# Patient Record
Sex: Female | Born: 1985 | Race: Asian | Hispanic: No | Marital: Married | Smoking: Never smoker
Health system: Southern US, Community
[De-identification: ages and names within clinical notes are randomized; demographics above are authoritative.]

## PROBLEM LIST (undated history)

## (undated) DIAGNOSIS — D649 Anemia, unspecified: Secondary | ICD-10-CM

## (undated) DIAGNOSIS — D569 Thalassemia, unspecified: Secondary | ICD-10-CM

## (undated) DIAGNOSIS — D571 Sickle-cell disease without crisis: Secondary | ICD-10-CM

## (undated) DIAGNOSIS — Q211 Atrial septal defect, unspecified: Secondary | ICD-10-CM

## (undated) HISTORY — PX: OTHER SURGICAL HISTORY: SHX169

## (undated) HISTORY — DX: Sickle-cell disease without crisis: D57.1

---

## 2007-02-23 HISTORY — PX: ASD REPAIR: SHX258

## 2007-04-25 ENCOUNTER — Ambulatory Visit: Payer: Self-pay | Admitting: Internal Medicine

## 2007-04-25 ENCOUNTER — Inpatient Hospital Stay (HOSPITAL_COMMUNITY): Admission: EM | Admit: 2007-04-25 | Discharge: 2007-04-28 | Payer: Self-pay | Admitting: Family Medicine

## 2007-04-25 ENCOUNTER — Encounter (INDEPENDENT_AMBULATORY_CARE_PROVIDER_SITE_OTHER): Payer: Self-pay | Admitting: Emergency Medicine

## 2007-05-01 ENCOUNTER — Ambulatory Visit: Payer: Self-pay | Admitting: Family Medicine

## 2007-05-01 ENCOUNTER — Encounter (INDEPENDENT_AMBULATORY_CARE_PROVIDER_SITE_OTHER): Payer: Self-pay | Admitting: Family Medicine

## 2007-05-01 DIAGNOSIS — R7881 Bacteremia: Secondary | ICD-10-CM | POA: Insufficient documentation

## 2007-05-02 ENCOUNTER — Encounter (INDEPENDENT_AMBULATORY_CARE_PROVIDER_SITE_OTHER): Payer: Self-pay | Admitting: Family Medicine

## 2007-05-05 ENCOUNTER — Encounter (INDEPENDENT_AMBULATORY_CARE_PROVIDER_SITE_OTHER): Payer: Self-pay | Admitting: Family Medicine

## 2007-05-05 ENCOUNTER — Ambulatory Visit: Payer: Self-pay | Admitting: Family Medicine

## 2007-05-05 DIAGNOSIS — Q211 Atrial septal defect: Secondary | ICD-10-CM

## 2007-05-05 DIAGNOSIS — J019 Acute sinusitis, unspecified: Secondary | ICD-10-CM

## 2007-05-05 DIAGNOSIS — R17 Unspecified jaundice: Secondary | ICD-10-CM | POA: Insufficient documentation

## 2007-05-05 DIAGNOSIS — D6489 Other specified anemias: Secondary | ICD-10-CM

## 2007-05-05 LAB — CONVERTED CEMR LAB
ALT: 13 units/L (ref 0–35)
Alkaline Phosphatase: 63 units/L (ref 39–117)
Creatinine, Ser: 0.53 mg/dL (ref 0.40–1.20)
HCT: 38.9 % (ref 36.0–46.0)
MCHC: 30.6 g/dL (ref 30.0–36.0)
MCV: 80.2 fL (ref 78.0–100.0)
Platelets: 533 10*3/uL — ABNORMAL HIGH (ref 150–400)
RDW: 16.8 % — ABNORMAL HIGH (ref 11.5–15.5)
Sodium: 139 meq/L (ref 135–145)
Total Bilirubin: 1.4 mg/dL — ABNORMAL HIGH (ref 0.3–1.2)
Total Protein: 8 g/dL (ref 6.0–8.3)

## 2007-05-08 ENCOUNTER — Encounter (INDEPENDENT_AMBULATORY_CARE_PROVIDER_SITE_OTHER): Payer: Self-pay | Admitting: Family Medicine

## 2007-07-14 ENCOUNTER — Encounter (INDEPENDENT_AMBULATORY_CARE_PROVIDER_SITE_OTHER): Payer: Self-pay | Admitting: Family Medicine

## 2007-08-10 ENCOUNTER — Ambulatory Visit: Payer: Self-pay | Admitting: Family Medicine

## 2007-08-10 DIAGNOSIS — R636 Underweight: Secondary | ICD-10-CM | POA: Insufficient documentation

## 2007-08-15 ENCOUNTER — Encounter (INDEPENDENT_AMBULATORY_CARE_PROVIDER_SITE_OTHER): Payer: Self-pay | Admitting: Family Medicine

## 2007-08-19 ENCOUNTER — Encounter (INDEPENDENT_AMBULATORY_CARE_PROVIDER_SITE_OTHER): Payer: Self-pay | Admitting: Family Medicine

## 2007-11-23 ENCOUNTER — Encounter (INDEPENDENT_AMBULATORY_CARE_PROVIDER_SITE_OTHER): Payer: Self-pay | Admitting: Family Medicine

## 2007-11-30 ENCOUNTER — Encounter (INDEPENDENT_AMBULATORY_CARE_PROVIDER_SITE_OTHER): Payer: Self-pay | Admitting: Family Medicine

## 2007-12-22 ENCOUNTER — Ambulatory Visit: Payer: Self-pay | Admitting: Family Medicine

## 2007-12-22 ENCOUNTER — Encounter (INDEPENDENT_AMBULATORY_CARE_PROVIDER_SITE_OTHER): Payer: Self-pay | Admitting: Family Medicine

## 2007-12-25 LAB — CONVERTED CEMR LAB
ALT: 9 units/L (ref 0–35)
BUN: 18 mg/dL (ref 6–23)
CO2: 22 meq/L (ref 19–32)
Calcium: 9.3 mg/dL (ref 8.4–10.5)
Chloride: 102 meq/L (ref 96–112)
Creatinine, Ser: 0.58 mg/dL (ref 0.40–1.20)
HCT: 32.8 % — ABNORMAL LOW (ref 36.0–46.0)
Hemoglobin: 9.9 g/dL — ABNORMAL LOW (ref 12.0–15.0)
MCV: 77.5 fL — ABNORMAL LOW (ref 78.0–100.0)
Platelets: 386 10*3/uL (ref 150–400)
RDW: 14.8 % (ref 11.5–15.5)
Total Bilirubin: 2.5 mg/dL — ABNORMAL HIGH (ref 0.3–1.2)
WBC: 9.1 10*3/uL (ref 4.0–10.5)

## 2009-02-22 HISTORY — PX: WISDOM TOOTH EXTRACTION: SHX21

## 2010-07-07 NOTE — Discharge Summary (Signed)
NAMEKARISA, NESSER                  ACCOUNT NO.:  [id number]   MEDICAL RECORD NO.:  [id number]          PATIENT TYPE:  INP   LOCATION:  3730                         FACILITY:  [facility]   PHYSICIAN:  Zenaida Deed. Mayford Knife, M.D.DATE OF BIRTH:  December 19, 1985   DATE OF ADMISSION:  04/25/2007  DATE OF DISCHARGE:  04/28/2007                               DISCHARGE SUMMARY   DISCHARGE DIAGNOSES:  1. Congenital atrial septal defect.  2. Viral syndrome.  3. Microcytic anemia.  4. Hyperbilirubinemia.  5. One out of two positive blood cultures with gram positive rods.  6. Elevated ferritin.   DISCHARGE MEDICATIONS:  Guaifenesin 600 mg by mouth twice daily as  needed for cough.   CONSULTS:  None.   PROCEDURES:  1. Chest x-ray that showed an enlarged right ventricle and main      pulmonary artery with a question of primary pulmonary hypertension      with questionable left to right shunt but lungs clear.  2. 2D echocardiogram showed large ASD with no evidence of left to      right shunt at this time.   LABORATORY WORKUP:  Admission labs included a hemoglobin of 7.5 with  hematocrit of 24 with an MCV of 75.9 and an RDW of 14.1. White blood  cell count was 5.7. Platelets were 219. Reticulocyte count percent was  1.1. An iron panel showed an iron level of 23, a total iron binding  capacity of 304, percent saturation of 8. Vitamin B12 level of 548, a  folate of greater than 20, and a ferritin of 2,764. The patient had  haptaglobin level of less than 6, a total bilirubin of 2.7 with a direct  bilirubin of 0.7 and an indirect bilirubin of 2. An LDH was 254.  Peripheral smear showed a  few teardrop cells and rare spherocytes, rare  bands, and vacuolization and neutrophils. At the time of discharge,  hemoglobin electrophoresis was pending as was a Coombs. TSH was 2.802  __________ was within normal limits. Post-transfusion hemoglobin was 11  and hematocrit 34. Urinalysis was negative. Transaminases were  within  normal limits. An ESR was 8. Other studies that were pending at the time  of discharge included Coombs and hemoglobin electrophoresis, a hepatitis  panel. HIV was negative. Strep was negative, and flu was negative.   HOSPITAL COURSE:  Ms. Adolph is a 25 year old female from [place] who  presented with chronic shortness of breath and fatigue with minimal  exertion secondary to a congenital atrial septal defect for which she is  followed by Dr. Ace Gins with [facility] in addition to four days of  fever, cough, congestion, myalgias, and vague abdominal pain. She denied  chest pain. In addition, she had nonbloody, nonbilious emesis and  diarrhea.   PROBLEMS:  1. Congenital heart disease. The patient has an atrial septal defect      with an ostium secundum with dilation of right heart chambers and      an enlarged left pulmonary artery. The patient had no clinical      signs of acute decompensation suggesting left  to right shunt. No      cyanosis and no hypoxia while in the hospital. Her condition was      discussed with her pediatric cardiologist, Dr. Ace Gins, who      recommended repair of her atrial septal defect via catheterization      in 6-8 weeks. The details of this hospitalization will be cc'd to      him as well as Ms. Eberwein's contact information so that this      appointment may be set up.  2. Viral syndrome. The patient presented with fever, cough,      congestion, vomiting, diarrhea, and myalgias. White blood count      within normal limits. Urinalysis was negative for infection. Rapid      flu was negative. Strep was negative. Blood cultures were obtained.      With supportive care, the patient remained afebrile. Her vomiting      and diarrhea, myalgias, congestion, and cough resolved over the      course of the hospitalization without any antibiotics. She was      thought to have a general S viral illness.  3. Microcytic anemia. The patient presented with a hemoglobin  of 7.5      with an MCV of 75.9. She did not have any source of active      bleeding. Her microcytic anemia was found to be secondary to a      mixed picture partial iron deficiency. The patient also had a      component of hemolysis with an elevated bilirubin, sphereocytes on      smear and a slightly elevated LDH. It was noted that she has mild      icterus at baseline. It was also noted that a relative  also      visiting the hospital also had mild icterus. Etiology of this      personal component of hemolytic anemia is unknown, but it certainly      could be hereditary including hereditary spherocytosis G6PD      deficiency. In addition, the patient has a component of iron      deficiency which is likely nutritional. The patient responded      appropriately to 2 units of packed red blood cells and was      discharged with a hemoglobin of 11.9. Further workup of her anemia      includes a hemoglobin electrophoresis and a Coombs  which were      pending at the time of discharge. See above laboratory section for      complete anemia workup performed during this hospitalization.  4. Elevated ferritin. This is likely an acute phase reactant. A sed      rate was obtained. It came back at 8. HIV was nonreactive, and      hepatitis panel was pending at the time of discharge. This should      be followed.  5. One out of two positive blood culture. After the patient was      discharged at the time of this dictation this morning one out of      two of the patient's blood cultures returned as positive for gram      positive rods. This is likely a contaminant; however, the patient      has been called, and she is agreeable to returning to our clinic      today, this afternoon, for two repeat blood cultures. She is  afebrile and remains asymptomatic. She did not receive any      antibiotics in the hospital. This is unlikely a bacterial      endocarditis; however, we will follow up on the two  repeat blood      cultures that she will have performed today as it is possible that      she could have had diphtheria with the symptoms she had and her      being an immigrant from [place] we are unsure of her vaccination      status. Of note, she was given her flu and pneumococcal vaccines      while in the hospital.   FOLLOWUP:  The patient is to have two repeat blood cultures drawn today,  and she will follow up with Dr. Luz Brazen at [facility] on  Friday, March 13th, at 1:30 p.m. She is to follow up with Dr. Ace Gins for  repair of atrial septal defect via catheterization in six to eight  weeks. Dr. Blima Singer office will call the patient to schedule this  appointment. Contact numbers for the patient include Daihsiu  at [phone], Hret's, who is her brother, phone number is [phone], and  Allison Quarry who is her [employer] whose phone number is [phone].      Levander Campion, M.D.  Electronically Signed      Zenaida Deed. Mayford Knife, M.D.  Electronically Signed    JH/MEDQ  D:  05/01/2007  T:  05/01/2007  Job:  [id number]   cc:   Rosiland Oz

## 2010-11-16 LAB — INFLUENZA A & B ANTIBODIES
Influenza A Virus Ab, IgG: 0.47 IV
Influenza A Virus Ab, IgM: 0.06 IV
Influenza B Virus Ab, IgM: 0.1 IV
Influenza B ab, IgG: 0.37 IV

## 2010-11-16 LAB — DIFFERENTIAL
Basophils Absolute: 0
Lymphocytes Relative: 28
Lymphs Abs: 2.5
Monocytes Absolute: 1.1 — ABNORMAL HIGH
Monocytes Relative: 12
Neutro Abs: 5.3

## 2010-11-16 LAB — HEMOGLOBIN AND HEMATOCRIT, BLOOD: Hemoglobin: 12.1

## 2010-11-16 LAB — HEPATIC FUNCTION PANEL
ALT: 13
AST: 33
Alkaline Phosphatase: 45
Bilirubin, Direct: 0.7 — ABNORMAL HIGH

## 2010-11-16 LAB — BASIC METABOLIC PANEL
BUN: 7
CO2: 25
Glucose, Bld: 81
Potassium: 3.3 — ABNORMAL LOW
Sodium: 136

## 2010-11-16 LAB — CBC
HCT: 24 — ABNORMAL LOW
HCT: 26.5 — ABNORMAL LOW
HCT: 28.4 — ABNORMAL LOW
HCT: 36.4
Hemoglobin: 7.5 — CL
Hemoglobin: 8.3 — ABNORMAL LOW
Hemoglobin: 8.8 — ABNORMAL LOW
MCHC: 31.2
MCHC: 31.3
MCHC: 32.6
MCV: 76.4 — ABNORMAL LOW
MCV: 80.5
RBC: 3.73 — ABNORMAL LOW
RBC: 4.22
RBC: 4.52
RDW: 13.7
RDW: 14
RDW: 14.1
WBC: 5.3
WBC: 8.9

## 2010-11-16 LAB — HEPATITIS PANEL, ACUTE: Hep A IgM: NEGATIVE

## 2010-11-16 LAB — COMPREHENSIVE METABOLIC PANEL
ALT: 10
AST: 23
AST: 25
Albumin: 3 — ABNORMAL LOW
BUN: 5 — ABNORMAL LOW
CO2: 24
CO2: 24
Calcium: 8.4
Chloride: 103
Chloride: 103
Creatinine, Ser: 0.57
GFR calc Af Amer: >60
GFR calc Af Amer: >60
GFR calc non Af Amer: >60
GFR calc non Af Amer: >60
Sodium: 135
Total Bilirubin: 1.5 — ABNORMAL HIGH
Total Bilirubin: 1.6 — ABNORMAL HIGH

## 2010-11-16 LAB — HIV ANTIBODY (ROUTINE TESTING W REFLEX): HIV: NONREACTIVE

## 2010-11-16 LAB — HEMOGLOBINOPATHY EVALUATION
Hgb A2 Quant: 4.2 — ABNORMAL HIGH
Hgb F Quant: 0 (ref 0.0–2.0)
Hgb S Quant: 0 % (ref 0.0–0.0)

## 2010-11-16 LAB — POCT URINALYSIS DIP (DEVICE)
Ketones, ur: 15 — AB
Operator id: 235561
Specific Gravity, Urine: 1.02

## 2010-11-16 LAB — CROSSMATCH: ABO/RH(D): O POS

## 2010-11-16 LAB — POCT I-STAT, CHEM 8
BUN: 15
Chloride: 97
Creatinine, Ser: 0.8
Potassium: 3.7
Sodium: 135

## 2010-11-16 LAB — URINALYSIS, ROUTINE W REFLEX MICROSCOPIC
Bilirubin Urine: NEGATIVE
Glucose, UA: NEGATIVE
Hgb urine dipstick: NEGATIVE
Protein, ur: NEGATIVE
Specific Gravity, Urine: 1.006
Urobilinogen, UA: 1

## 2010-11-16 LAB — RETICULOCYTES: RBC.: 4.32

## 2010-11-16 LAB — IRON AND TIBC
Saturation Ratios: 8 — ABNORMAL LOW
TIBC: 304

## 2010-11-16 LAB — LACTATE DEHYDROGENASE: LDH: 254 — ABNORMAL HIGH

## 2010-11-16 LAB — FERRITIN: Ferritin: 2764 — ABNORMAL HIGH (ref 10–291)

## 2010-11-16 LAB — CULTURE, BLOOD (ROUTINE X 2): Culture: NO GROWTH

## 2010-11-16 LAB — POCT PREGNANCY, URINE
Operator id: 235561
Preg Test, Ur: NEGATIVE

## 2010-11-16 LAB — SEDIMENTATION RATE: Sed Rate: 8

## 2010-11-16 LAB — FOLATE: Folate: >20

## 2011-01-18 ENCOUNTER — Other Ambulatory Visit (HOSPITAL_COMMUNITY): Payer: Self-pay | Admitting: Nurse Practitioner

## 2011-01-18 DIAGNOSIS — Z0489 Encounter for examination and observation for other specified reasons: Secondary | ICD-10-CM

## 2011-01-18 LAB — ABO/RH: RH Type: POSITIVE

## 2011-01-18 LAB — GC/CHLAMYDIA PROBE AMP, GENITAL: Gonorrhea: NEGATIVE

## 2011-01-18 LAB — RPR: RPR: NONREACTIVE

## 2011-01-22 ENCOUNTER — Ambulatory Visit (HOSPITAL_COMMUNITY)
Admission: RE | Admit: 2011-01-22 | Discharge: 2011-01-22 | Disposition: A | Discharge status: Home / Self Care | Payer: Medicaid Other | Source: Ambulatory Visit | Attending: Nurse Practitioner | Admitting: Nurse Practitioner

## 2011-01-22 DIAGNOSIS — Z1389 Encounter for screening for other disorder: Secondary | ICD-10-CM | POA: Insufficient documentation

## 2011-01-22 DIAGNOSIS — O3500X0 Maternal care for (suspected) central nervous system malformation or damage in fetus, unspecified, not applicable or unspecified: Secondary | ICD-10-CM | POA: Insufficient documentation

## 2011-01-22 DIAGNOSIS — O350XX0 Maternal care for (suspected) central nervous system malformation in fetus, not applicable or unspecified: Secondary | ICD-10-CM | POA: Insufficient documentation

## 2011-01-22 DIAGNOSIS — Z0489 Encounter for examination and observation for other specified reasons: Secondary | ICD-10-CM

## 2011-01-22 DIAGNOSIS — O358XX0 Maternal care for other (suspected) fetal abnormality and damage, not applicable or unspecified: Secondary | ICD-10-CM | POA: Insufficient documentation

## 2011-01-22 DIAGNOSIS — Z363 Encounter for antenatal screening for malformations: Secondary | ICD-10-CM | POA: Insufficient documentation

## 2011-01-22 NOTE — Progress Notes (Addendum)
Genetic Counseling  High-Risk Gestation Note  Appointment Date:  01/22/2011 Referred By: Orlene Erm Date of Birth:  1985/08/20 Attending: Particia Nearing, MD  Ms. Kimberlie Tungate was seen for consultation for genetic counseling because of a personal history of atrial septal defect and for an elevated MSAFP of 2.89 MoMs based on maternal serum screening through [facility].    We reviewed Ms. Poynor's maternal serum screening result, the elevation of MSAFP, and the associated 1-2% risk for a fetal open neural tube defect.   We reviewed ONTDs, the typical multifactorial etiology, and variable prognosis.  In addition, we discussed additional explanations for an elevated MSAFP including: normal variation, twins, feto-maternal bleeding, a gestational dating error, abdominal wall defects, kidney differences, oligohydramnios, and placental problems.   We reviewed additional available screening and diagnostic options including detailed ultrasound and amniocentesis.  We discussed the risks, limitations, and benefits of each.  After thoughtful consideration of these options, Ms. Danielson elected to have ultrasound, but declined amniocentesis.  She understands that ultrasound cannot rule out all birth defects or genetic syndromes.  However, she was counseled that 80-90% of fetuses with ONTDs can be detected by detailed 2nd trimester ultrasound, when well visualized.  A complete ultrasound was performed today which indicated the pregnancy to be 26 weeks and 0 days gestation, thus changing her due date. Therefore, she was too far along in pregnancy to interpret the results of her MSAFP, meaning that her pregnancy does not have an increased risk for ONTDs based on this serum screen. The ultrasound report will be sent under separate cover.  Both family histories were reviewed and found to be contributory for the patient with surgical repair of atrial septal defect (ASD) at age 54 years. She reported that this  was not diagnosed until age 89 years when her family moved from [place] to the [place]. She is otherwise healthy. Congenital heart defects occur in approximately 1% of pregnancies.  Congenital heart defects may occur due to multifactorial influences, chromosomal abnormalities, genetic syndromes or environmental exposures.  Isolated heart defects are generally multifactorial.  Given the reported family history and assuming multifactorial inheritance, the risk for a congenital heart defect in the current pregnancy is approximately 4-4.5%. We discussed the option of targeted ultrasound and fetal echocardiogram to assess fetal anatomy and fetal heart in more detail. The patient understands that ultrasound cannot rule out all birth defects prenatally. Without further information regarding the provided family history, an accurate genetic risk cannot be calculated. Further genetic counseling is warranted if more information is obtained.  Ms. Gazelle Towe denied exposure to environmental toxins or chemical agents. She denied the use of alcohol, tobacco or street drugs. She denied significant viral illnesses during the course of her pregnancy. Her medical and surgical histories were contributory for heart surgery in 2009.   I counseled Ms. Kaila Matteucci for approximately 35 minutes regarding the above risks and available options.    Quinn Plowman, MS,  Certified Genetic Counselor 01/22/2011

## 2011-02-01 ENCOUNTER — Other Ambulatory Visit: Payer: Self-pay

## 2011-02-23 NOTE — L&D Delivery Note (Signed)
Delivery Note At 2:40 AM a viable female was delivered via Vaginal, Spontaneous Delivery (Presentation: Right Occiput Anterior).  APGAR: 9, 9; weight 5 lb 11.7 oz (2600 g).   Placenta status: Intact Adherent, Manual removal Retained.  Cord: 3 vessels with the following complications: None.  Cord pH: NA  Anesthesia: Local  Episiotomy: None Lacerations: 2nd degree;Sulcus Suture Repair: 3.0 vicryl rapide Est. Blood Loss (mL): 500  Mom to postpartum.  Baby to nursery-stable. Placenta to: BS Feeding: br/bo Circ: NA Contraception: Sheila Torres, Sheila Torres 04/24/2011, 4:11 AM

## 2011-04-07 LAB — STREP B DNA PROBE: GBS: POSITIVE

## 2011-04-23 ENCOUNTER — Encounter (HOSPITAL_COMMUNITY): Payer: Self-pay

## 2011-04-23 ENCOUNTER — Inpatient Hospital Stay (HOSPITAL_COMMUNITY)
Admission: AD | Admit: 2011-04-23 | Discharge: 2011-04-26 | DRG: 774 | Disposition: A | Discharge status: Home / Self Care | Payer: Medicaid Other | Source: Ambulatory Visit | Attending: Obstetrics & Gynecology | Admitting: Obstetrics & Gynecology

## 2011-04-23 DIAGNOSIS — Z22330 Carrier of Group B streptococcus: Secondary | ICD-10-CM

## 2011-04-23 DIAGNOSIS — O99892 Other specified diseases and conditions complicating childbirth: Secondary | ICD-10-CM | POA: Diagnosis present

## 2011-04-23 DIAGNOSIS — IMO0001 Reserved for inherently not codable concepts without codable children: Secondary | ICD-10-CM

## 2011-04-23 HISTORY — DX: Anemia, unspecified: D64.9

## 2011-04-23 HISTORY — DX: Thalassemia, unspecified: D56.9

## 2011-04-23 HISTORY — DX: Atrial septal defect: Q21.1

## 2011-04-23 HISTORY — DX: Atrial septal defect, unspecified: Q21.10

## 2011-04-23 NOTE — Progress Notes (Signed)
Patient is here for labor eval. Patient is uncomfortable, denies lof. Reports good fetal movement.

## 2011-04-23 NOTE — ED Provider Notes (Signed)
History    Chief Complaint  Patient presents with  . Contractions   HPI Patient is a 26 yo G1 at [redacted]w[redacted]d presenting with one day history of contractions. She states she has had contractions all day, but she has not timed them. She has had some mucus discharge, and minimal bleeding. No gush of fluid. Good fetal movement.   OB History    Grav Para Term Preterm Abortions TAB SAB Ect Mult Living   1               Past Medical History  Diagnosis Date  . Atrial septal defect     had repair years ago  . Anemia   . Thalassemia     Past Surgical History  Procedure Date  . Atrail septal defect repair     History reviewed. No pertinent family history.  History  Substance Use Topics  . Smoking status: Never Smoker   . Smokeless tobacco: Not on file  . Alcohol Use: No    Allergies: Allergies not on file  Prescriptions prior to admission  Medication Sig Dispense Refill  . azithromycin (ZITHROMAX) 500 MG tablet Take 500 mg by mouth daily. X 5 days       . ferrous sulfate 325 (65 FE) MG tablet Take 325 mg by mouth 2 (two) times daily.          ROS See HPI above  Physical Exam   Blood pressure 125/73, pulse 90, temperature 96.5 F (35.8 C), temperature source Oral, resp. rate 20.  Physical Exam  Constitutional: She appears well-developed and well-nourished. She appears distressed (Very uncomfortable with contractions. Breathing through.).  HENT:  Head: Normocephalic and atraumatic.  Cardiovascular: Normal rate and regular rhythm.   Respiratory: Effort normal and breath sounds normal.  GI: Soft.       Gravid, toco in place  Musculoskeletal: Normal range of motion. She exhibits no edema.  Neurological: She is alert.    MAU Course  Procedures  Patient was brought to exam room and cervical check was done by RN since patient was having increased pressure Dilation: 3.5 Effacement (%): 90 Cervical Position: Middle Station: -1 (bulging) Presentation: Vertex Exam by::  Peace, rn  Repeat exam: Cervix dilated to 6cm. Dorathy Kinsman, CNM notified for admission   Assessment and Plan  26 yo G1 at [redacted]w[redacted]d presenting with contractions. - Observed in MAU. Patient continued to have contractions with change in cervix - Admitted to birthing suite   Imagene Boss 04/23/2011, 11:49 PM

## 2011-04-24 ENCOUNTER — Encounter (HOSPITAL_COMMUNITY): Payer: Self-pay | Admitting: Pediatric Intensive Care

## 2011-04-24 DIAGNOSIS — O9989 Other specified diseases and conditions complicating pregnancy, childbirth and the puerperium: Secondary | ICD-10-CM

## 2011-04-24 LAB — COMPREHENSIVE METABOLIC PANEL
ALT: 12 U/L (ref 0–35)
AST: 35 U/L (ref 0–37)
Albumin: 2.4 g/dL — ABNORMAL LOW (ref 3.5–5.2)
Alkaline Phosphatase: 150 U/L — ABNORMAL HIGH (ref 39–117)
BUN: 9 mg/dL (ref 6–23)
Chloride: 99 mEq/L (ref 96–112)
Potassium: 3.4 mEq/L — ABNORMAL LOW (ref 3.5–5.1)
Total Bilirubin: 0.8 mg/dL (ref 0.3–1.2)

## 2011-04-24 LAB — CBC
HCT: 37.1 % (ref 36.0–46.0)
HCT: 30.3 % — ABNORMAL LOW (ref 36.0–46.0)
MCH: 25.2 pg — ABNORMAL LOW (ref 26.0–34.0)
MCHC: 31.3 g/dL (ref 30.0–36.0)
MCV: 80.5 fL (ref 78.0–100.0)
Platelets: 407 10*3/uL — ABNORMAL HIGH (ref 150–400)
RDW: 15.6 % — ABNORMAL HIGH (ref 11.5–15.5)
RDW: 15.5 % (ref 11.5–15.5)
WBC: 20.8 10*3/uL — ABNORMAL HIGH (ref 4.0–10.5)
WBC: 25.3 10*3/uL — ABNORMAL HIGH (ref 4.0–10.5)

## 2011-04-24 MED ORDER — IBUPROFEN 600 MG PO TABS
600.0000 mg | ORAL_TABLET | Freq: Four times a day (QID) | ORAL | Status: DC | PRN
  Administered 2011-04-24: 600 mg via ORAL
  Filled 2011-04-24: qty 1

## 2011-04-24 MED ORDER — ONDANSETRON HCL 4 MG/2ML IJ SOLN: 4.0000 mg | INTRAMUSCULAR | Status: DC | PRN

## 2011-04-24 MED ORDER — CITRIC ACID-SODIUM CITRATE 334-500 MG/5ML PO SOLN: 30.0000 mL | ORAL | Status: DC | PRN

## 2011-04-24 MED ORDER — TETANUS-DIPHTH-ACELL PERTUSSIS 5-2.5-18.5 LF-MCG/0.5 IM SUSP
0.5000 mL | Freq: Once | INTRAMUSCULAR | Status: AC
  Administered 2011-04-25: 0.5 mL via INTRAMUSCULAR
  Filled 2011-04-24: qty 0.5

## 2011-04-24 MED ORDER — MISOPROSTOL 200 MCG PO TABS
ORAL_TABLET | ORAL | Status: AC
  Filled 2011-04-24: qty 5

## 2011-04-24 MED ORDER — OXYTOCIN BOLUS FROM INFUSION
500.0000 mL | Freq: Once | INTRAVENOUS | Status: DC
  Filled 2011-04-24: qty 500
  Filled 2011-04-24: qty 1000

## 2011-04-24 MED ORDER — ONDANSETRON HCL 4 MG/2ML IJ SOLN: 4.0000 mg | Freq: Four times a day (QID) | INTRAMUSCULAR | Status: DC | PRN

## 2011-04-24 MED ORDER — SIMETHICONE 80 MG PO CHEW: 80.0000 mg | CHEWABLE_TABLET | ORAL | Status: DC | PRN

## 2011-04-24 MED ORDER — DIPHENHYDRAMINE HCL 25 MG PO CAPS: 25.0000 mg | ORAL_CAPSULE | Freq: Four times a day (QID) | ORAL | Status: DC | PRN

## 2011-04-24 MED ORDER — WITCH HAZEL-GLYCERIN EX PADS: 1.0000 "application " | MEDICATED_PAD | CUTANEOUS | Status: DC | PRN

## 2011-04-24 MED ORDER — OXYCODONE-ACETAMINOPHEN 5-325 MG PO TABS
1.0000 | ORAL_TABLET | ORAL | Status: DC | PRN
  Administered 2011-04-24: 1 via ORAL
  Filled 2011-04-24: qty 1

## 2011-04-24 MED ORDER — FLEET ENEMA 7-19 GM/118ML RE ENEM: 1.0000 | ENEMA | RECTAL | Status: DC | PRN

## 2011-04-24 MED ORDER — NALBUPHINE SYRINGE 5 MG/0.5 ML
5.0000 mg | INJECTION | INTRAMUSCULAR | Status: DC | PRN
  Filled 2011-04-24: qty 0.5

## 2011-04-24 MED ORDER — LACTATED RINGERS IV SOLN: INTRAVENOUS | Status: DC

## 2011-04-24 MED ORDER — MISOPROSTOL 200 MCG PO TABS
1000.0000 ug | ORAL_TABLET | Freq: Once | ORAL | Status: AC
  Administered 2011-04-24: 1000 ug via RECTAL

## 2011-04-24 MED ORDER — HYDROXYZINE HCL 50 MG/ML IM SOLN: 50.0000 mg | Freq: Four times a day (QID) | INTRAMUSCULAR | Status: DC | PRN

## 2011-04-24 MED ORDER — DIBUCAINE 1 % RE OINT: 1.0000 "application " | TOPICAL_OINTMENT | RECTAL | Status: DC | PRN

## 2011-04-24 MED ORDER — LACTATED RINGERS IV SOLN: 500.0000 mL | INTRAVENOUS | Status: DC | PRN

## 2011-04-24 MED ORDER — LIDOCAINE HCL (PF) 1 % IJ SOLN
30.0000 mL | INTRAMUSCULAR | Status: DC | PRN
  Administered 2011-04-24: 30 mL via SUBCUTANEOUS
  Filled 2011-04-24: qty 30

## 2011-04-24 MED ORDER — ACETAMINOPHEN 325 MG PO TABS: 650.0000 mg | ORAL_TABLET | ORAL | Status: DC | PRN

## 2011-04-24 MED ORDER — ONDANSETRON HCL 4 MG PO TABS: 4.0000 mg | ORAL_TABLET | ORAL | Status: DC | PRN

## 2011-04-24 MED ORDER — BENZOCAINE-MENTHOL 20-0.5 % EX AERO: 1.0000 "application " | INHALATION_SPRAY | CUTANEOUS | Status: DC | PRN

## 2011-04-24 MED ORDER — ZOLPIDEM TARTRATE 5 MG PO TABS: 5.0000 mg | ORAL_TABLET | Freq: Every evening | ORAL | Status: DC | PRN

## 2011-04-24 MED ORDER — HYDROXYZINE HCL 50 MG PO TABS: 50.0000 mg | ORAL_TABLET | Freq: Four times a day (QID) | ORAL | Status: DC | PRN

## 2011-04-24 MED ORDER — BENZOCAINE-MENTHOL 20-0.5 % EX AERO
INHALATION_SPRAY | CUTANEOUS | Status: AC
  Administered 2011-04-24: 08:00:00
  Filled 2011-04-24: qty 56

## 2011-04-24 MED ORDER — PRENATAL MULTIVITAMIN CH
1.0000 | ORAL_TABLET | Freq: Every day | ORAL | Status: DC
  Administered 2011-04-24 – 2011-04-25 (×2): 1 via ORAL
  Filled 2011-04-24 (×2): qty 1

## 2011-04-24 MED ORDER — OXYTOCIN 20 UNITS IN LACTATED RINGERS INFUSION - SIMPLE
125.0000 mL/h | Freq: Once | INTRAVENOUS | Status: AC
  Administered 2011-04-24: 125 mL/h via INTRAVENOUS

## 2011-04-24 MED ORDER — SENNOSIDES-DOCUSATE SODIUM 8.6-50 MG PO TABS
2.0000 | ORAL_TABLET | Freq: Every day | ORAL | Status: DC
  Administered 2011-04-24 – 2011-04-25 (×2): 2 via ORAL

## 2011-04-24 MED ORDER — OXYCODONE-ACETAMINOPHEN 5-325 MG PO TABS: 1.0000 | ORAL_TABLET | ORAL | Status: DC | PRN

## 2011-04-24 MED ORDER — IBUPROFEN 600 MG PO TABS
600.0000 mg | ORAL_TABLET | Freq: Four times a day (QID) | ORAL | Status: DC
  Administered 2011-04-24 – 2011-04-26 (×8): 600 mg via ORAL
  Filled 2011-04-24 (×8): qty 1

## 2011-04-24 MED ORDER — LANOLIN HYDROUS EX OINT: TOPICAL_OINTMENT | CUTANEOUS | Status: DC | PRN

## 2011-04-24 MED ORDER — SODIUM CHLORIDE 0.9 % IV SOLN
2.0000 g | Freq: Once | INTRAVENOUS | Status: AC
  Administered 2011-04-24: 2 g via INTRAVENOUS
  Filled 2011-04-24: qty 2000

## 2011-04-24 NOTE — Progress Notes (Signed)
Pt delivered viable female with APGARS 9, 9. Smith CNM present and [place], Resident.

## 2011-04-24 NOTE — Progress Notes (Signed)
Pt to room 108 via w/c in stable condition.

## 2011-04-24 NOTE — H&P (Signed)
Sheila Torres is a 26 y.o. year old G1P0 female at [redacted]w[redacted]d weeks gestation who presents to MAU reporting Labor. She denies VB or LOF.  Maternal Medical History:  Reason for admission: Reason for admission: contractions.  Contractions: Onset was 6-12 hours ago.   Frequency: regular.    Fetal activity: Perceived fetal activity is normal.    Prenatal Complications - Diabetes: none.    Patient Active Problem List  Diagnoses  . OTHER SPECIFIED ANEMIAS: alpha thalassemia   . SINUSITIS- ACUTE-NOS  . OSTIUM SECUNDUM TYPE ATRIAL SEPTAL DEFECT  . JAUNDICE  . UNDERWEIGHT  . BACTEREMIA    OB History    Grav Para Term Preterm Abortions TAB SAB Ect Mult Living   1              Past Medical History  Diagnosis Date  . Atrial septal defect     had repair years ago  . Anemia   . Thalassemia    Past Surgical History  Procedure Date  . Atrail septal defect repair    Family History: family history is not on file. Social History:  reports that she has never smoked. She does not have any smokeless tobacco history on file. She reports that she does not drink alcohol or use illicit drugs.  ROS: Otherwise neg  Dilation: 6.5 Effacement (%): 100 Station: 0 Exam by:: Peace, rn Blood pressure 125/73, pulse 90, temperature 96.5 F (35.8 C), temperature source Oral, resp. rate 20. Maternal Exam:  Uterine Assessment: Contraction strength is firm.  Contraction frequency is regular.   Abdomen: Fundal height is S=D.   Fetal presentation: vertex  Introitus: Normal vulva. Normal vagina.  Pelvis: adequate for delivery.   Cervix: Cervix evaluated by digital exam.     Fetal Exam Fetal Monitor Review: Mode: ultrasound.   Baseline rate: 120.  Variability: moderate (6-25 bpm).   Pattern: accelerations present and no decelerations.    Fetal State Assessment: Category I - tracings are normal.     Physical Exam  Nursing note and vitals reviewed. Constitutional: She is oriented to person, place,  and time. She appears well-developed and well-nourished. She appears distressed.  Cardiovascular: Normal rate.   Respiratory: Effort normal.  GI: Soft. There is no tenderness.  Genitourinary: Vagina normal and uterus normal.  Musculoskeletal: Normal range of motion.  Neurological: She is alert and oriented to person, place, and time. She has normal reflexes.  Skin: Skin is warm and dry.  Psychiatric: She has a normal mood and affect.   Dilation: 6.5 Effacement (%): 100 Cervical Position: Middle Station: 0 Presentation: Vertex Exam by:: Peace, rn Prenatal labs: ABO, Rh: O/Positive/-- (11/26 0000) Antibody:  neg Rubella:  Immune RPR:   NR HBsAg:   Neg HIV:   NR GBS: Positive (02/13 0000)  1 hour GTT 101 Too late for genetic screening  Assessment: 1. Labor: active 2. Fetal Wellbeing: Category I  3. Pain Control: none 4. GBS: pos 5. 39.1 week IUP  Plan:  1. Admit to BS per consult with MD 2. Routine L&D orders 3. Analgesia/anesthesia PRN  4. Ampicillin  Sheila Torres 04/24/2011, 12:41 AM

## 2011-04-24 NOTE — Progress Notes (Signed)
Provider made aware of pt status: VS, temp, voiding, fundus firm, lochia small, no clots. Will continue to monitor. Recheck temp in one hour with oral hydration.

## 2011-04-25 NOTE — Progress Notes (Signed)
Post Partum Day #1 Subjective: no complaints, up ad lib, voiding, tolerating PO and + flatus Breast feeding with no problems.   Objective: Blood pressure 104/66, pulse 93, temperature 98.6 F (37 C), temperature source Oral, resp. rate 18, height 5' (1.524 m), weight 53.071 kg (117 lb), SpO2 98.00%, unknown if currently breastfeeding.  Physical Exam:  General: alert, cooperative and no distress Lochia: appropriate Uterine Fundus: firm Incision: healing well DVT Evaluation: No evidence of DVT seen on physical exam.   Basename 04/24/11 0747 04/24/11 0050  HGB 9.3* 11.6*  HCT 30.3* 37.1    Assessment/Plan: Plan for discharge tomorrow, Breastfeeding, Lactation consult and Contraception does not desire to use contraception   LOS: 2 days   Sheila Torres 04/25/2011, 7:10 AM

## 2011-04-25 NOTE — Progress Notes (Signed)
PSYCHOSOCIAL ASSESSMENT ~ MATERNAL/CHILD Name: Sheila Torres, Sheila "girl" Age: 24 day Referral Date: 04/25/11 Reason/Source: LPNC  I. FAMILY/HOME ENVIRONMENT A. Child's Legal Guardian Name: Sheila Torres  DOB:   12/03/1985                                               Age: 26                   Address:[address]                                  [place] [address]                      Name: Sheila Torres,Sheila Torres  DOB:                                                  Age:                   Address: [address]                                  [place] [address]   B. Other Household Members/Support Persons Name: Relationship:                    DOB:        Name:                    Relationship:               DOB:        Name:                         Relationship:               DOB:                   Name:                   Relationship:               DOB: C.   Other Support:   II. PSYCHOSOCIAL DATA A. Information Source: MOB and FOB                    B. Financial and Community Resources         Employment:    Medicaid: Yes    County:  [place]  Private Insurance:                            Self Pay:   Food Stamps:        WIC: Looking into per MOB and FOB      Work First:       Public Housing:       Section 8:    Maternity Care Coordination/Child Service Coordination/Early Intervention   School:                                                                         Grade:  Other:  C. Cultural and Environment Information Cultural Issues Impacting Care III. STRENGTHS             Supportive family/friends: Yes             Adequate Resources: Yes             Compliance with medical plan: Yes             Home prepared for Child (including basic supplies): Yes             Understanding of Illness: N/A             Other:   IV. RISK FACTORS AND CURRENT PROBLEMS       No Problems Noted               Substance abuse:                                    Pt:            Family:  Family/Relationship Issues:                     Pt:            Family:             Financial Resources:                               Pt:            Family:             DSS Involvement:                                    Pt:             Family:             Knowledge/Cognitive Deficit:                   Pt:             Family:                Basic Needs(food, housing, etc.)             Pt:             Family:             Mental Illness:                                           Pt:             Family:             Abuse/Neglect/Domestic Violence           Pt:             Family:             Transportation:                                           Pt:              Family:             Adjustment to Illness:                               Pt:              Family:             Compliance with Treatment:                    Pt:              Family:             Housing Concerns                                   Pt:              Family:             Other:               V. SOCIAL WORK ASSESSMENT  CSW spoke to MOB and FOB.  Discussed LPNC, MOB reported issues with insurance earlier in pregnancy.  No concerns at this time.  Neither reported any concerns with family support or supplies when asked by CSW.  No hx of substance abuse.   MOB does not express any emotional concerns at this time, however knows to let RN or CSW aware if any concerning symptoms occur.  MOB and FOB understand hospital policy and need for drug screen.  UDS negative, will await MEC results.  CSW will follow up with DSS if needed once MEC results return.  Please reconsult CSW if any further needs arise.    VI. SOCIAL WORK PLAN (in bold)             No Further Intervention Required/ No Barriers to Discharge             Psychosocial Support and Ongoing Assessment if Needs             Patient/Family Education             Child Protective Services Report                      County:                       Date:              Information/Referral to Community Resources             Other 

## 2011-04-26 MED ORDER — PRENATAL MULTIVITAMIN CH: 1.0000 | ORAL_TABLET | Freq: Every day | ORAL | Status: DC

## 2011-04-26 MED ORDER — IBUPROFEN 600 MG PO TABS: 600.0000 mg | ORAL_TABLET | Freq: Four times a day (QID) | ORAL | Status: AC

## 2011-04-26 MED ORDER — OXYCODONE-ACETAMINOPHEN 5-325 MG PO TABS: 1.0000 | ORAL_TABLET | ORAL | Status: AC | PRN

## 2011-04-26 NOTE — Progress Notes (Signed)
Post Partum Day 2 Subjective: no complaints, up ad lib, voiding, tolerating PO and + flatus  Objective: Blood pressure 104/67, pulse 102, temperature 98.1 F (36.7 C), temperature source Oral, resp. rate 20, height 5' (1.524 m), weight 53.071 kg (117 lb), SpO2 98.00%, unknown if currently breastfeeding.  Physical Exam:  General: alert, cooperative, appears stated age and no distress Lochia: appropriate Uterine Fundus: firm Incision: n/a DVT Evaluation: No evidence of DVT seen on physical exam. Negative Homan's sign. No cords or calf tenderness. No significant calf/ankle edema.   Basename 04/24/11 0747 04/24/11 0050  HGB 9.3* 11.6*  HCT 30.3* 37.1    Assessment/Plan: Discharge home   LOS: 3 days   Sheila Torres DARLENE 04/26/2011, 6:58 AM

## 2011-04-26 NOTE — Discharge Summary (Signed)
Obstetric Discharge Summary Reason for Admission: onset of labor Prenatal Procedures: ultrasound Intrapartum Procedures: spontaneous vaginal delivery Postpartum Procedures: manual removal of placenta Complications-Operative and Postpartum: none Hemoglobin  Date Value Range Status  04/24/2011 9.3* 12.0-15.0 (g/dL) Final     DELTA CHECK NOTED     REPEATED TO VERIFY     HCT  Date Value Range Status  04/24/2011 30.3* 36.0-46.0 (%) Final    Discharge Diagnoses: Term Pregnancy-delivered  Discharge Information: Date: 04/26/2011 Activity: pelvic rest Diet: routine Medications: PNV, Ibuprofen and Percocet Condition: stable and improved Instructions: refer to practice specific booklet Discharge to: home Follow-up Information    Follow up with [facility] in 6 weeks.         Newborn Data: Live born female  Birth Weight: 5 lb 11.7 oz (2600 g) APGAR: 9, 9  Home with mother.  Efstathios Sawin DARLENE 04/26/2011, 7:03 AM

## 2011-04-26 NOTE — Discharge Instructions (Signed)
Vaginal Delivery Care After  Change your pad on each trip to the bathroom.   Wipe gently with toilet paper during your hospital stay. Always wipe from front to back. A spray bottle with warm tap water could also be used or a towelette if available.   Place your soiled pad and toilet paper in a bathroom wastebasket with a plastic bag liner.   During your hospital stay, save any clots. If you pass a clot while on the toilet, do not flush it. Also, if your vaginal flow seems excessive to you, notify nursing personnel.   The first time you get out of bed after delivery, wait for assistance from a nurse. Do not get up alone at any time if you feel weak or dizzy.   Bend and extend your ankles forcefully so that you feel the calves of your legs get hard. Do this 6 times every hour when you are in bed and awake.   Do not sit with one foot under you, dangle your legs over the edge of the bed, or maintain a position that hinders the circulation in your legs.   Many women experience after pains for 2 to 3 days after delivery. These after pains are mild uterine contractions. Ask the nurse for a pain medication if you need something for this. Sometimes breastfeeding stimulates after pains; if you find this to be true, ask for the medication  -  hour before the next feeding.   For you and your infant's protection, do not go beyond the door(s) of the obstetric unit. Do not carry your baby in your arms in the hallway. When taking your baby to and from your room, put your baby in the bassinet and push the bassinet.   Mothers may have their babies in their room as much as they desire.  Document Released: 02/06/2000 Document Revised: 01/28/2011 Document Reviewed: 01/06/2007 Exit[employer] Patient Information 2012 [employer]. 

## 2011-04-26 NOTE — Progress Notes (Signed)
UR chart review completed.  

## 2011-04-27 NOTE — Discharge Summary (Signed)
Agree with above note.  Sheila Torres H. 04/27/2011 9:13 AM

## 2013-04-29 IMAGING — US US OB DETAIL+14 WK
1 series · 14 of 28 positions shown · non-contrast
Comparison: none

[Series 1: us ob detail+14 wk · 0.21mm/px · 14 of 107 slices shown]
[im 4/107]
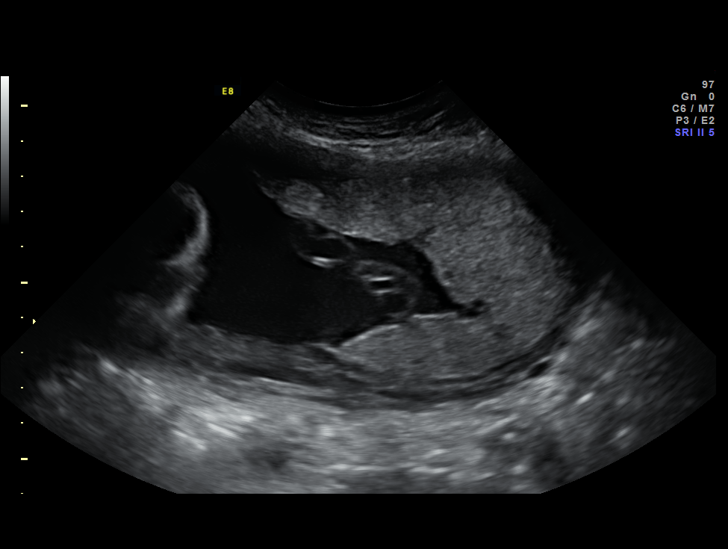
[im 12/107]
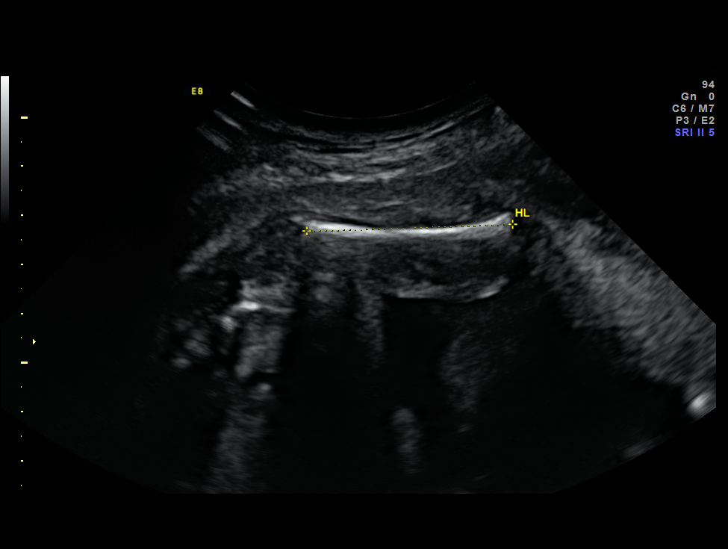
[im 20/107]
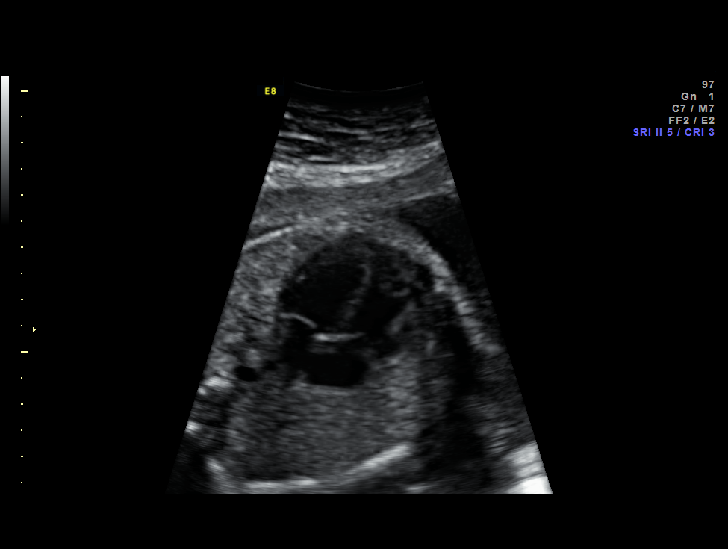
[im 28/107]
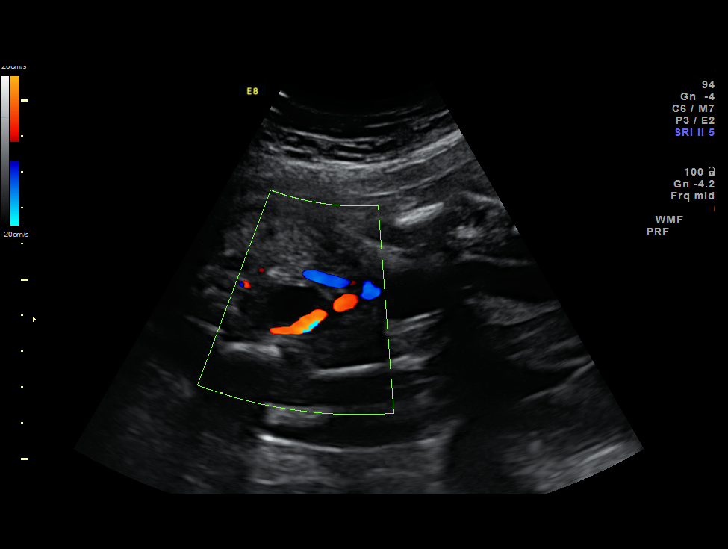
[im 36/107]
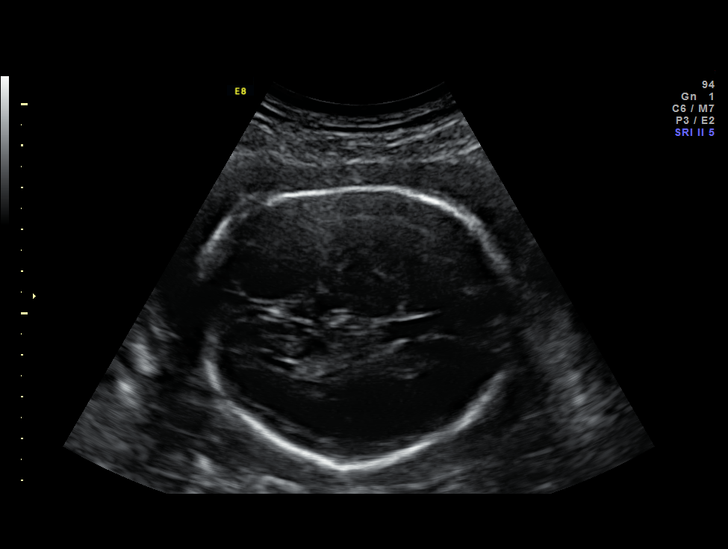
[im 44/107]
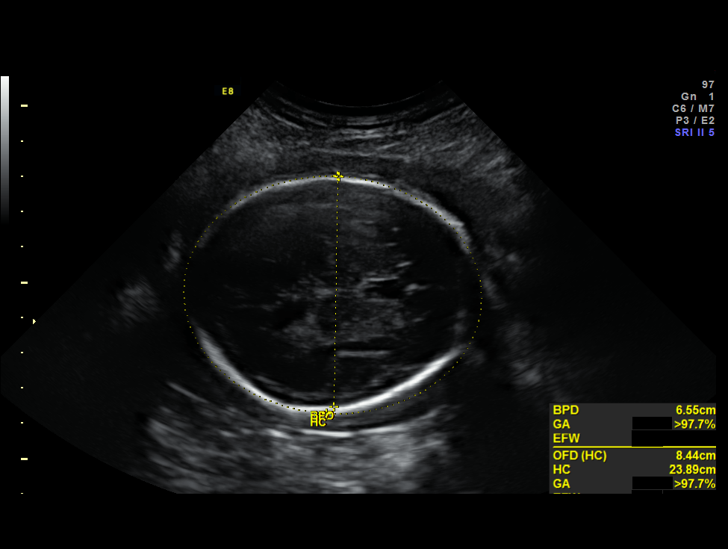
[im 52/107]
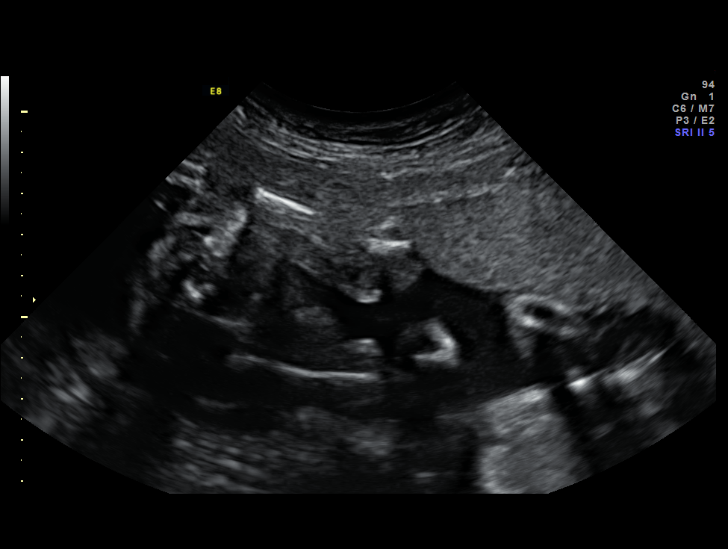
[im 59/107]
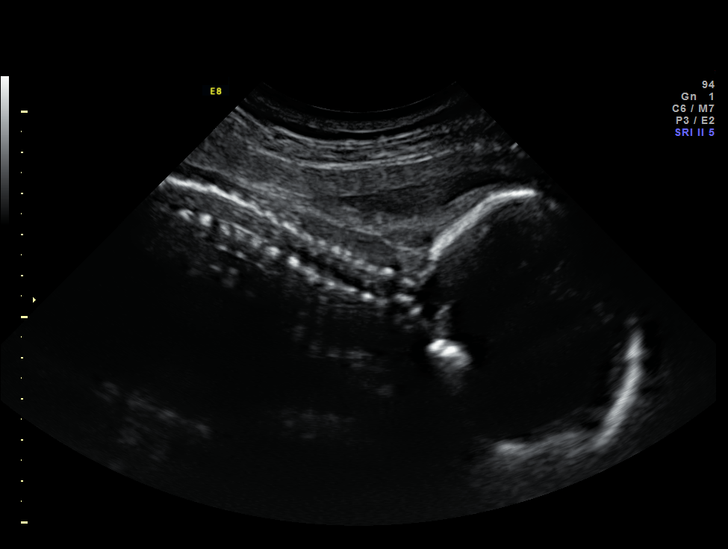
[im 67/107]
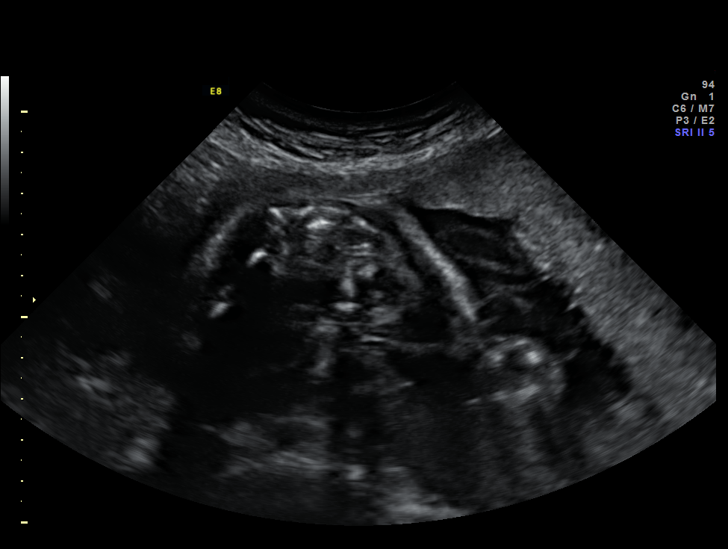
[im 75/107]
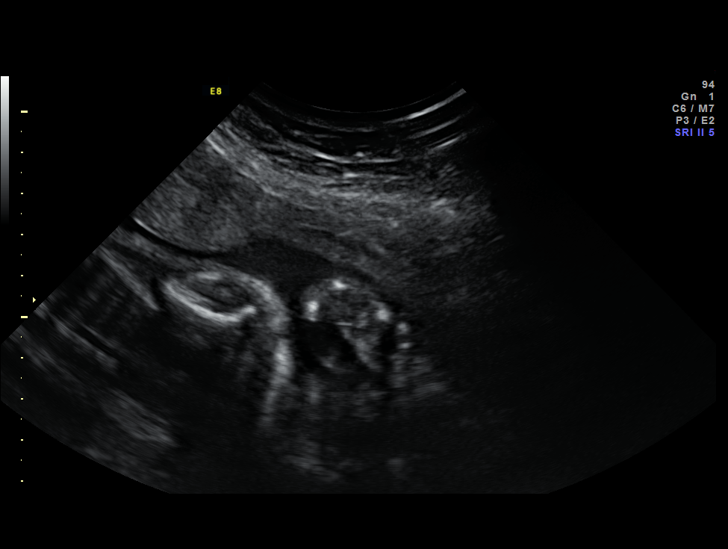
[im 83/107]
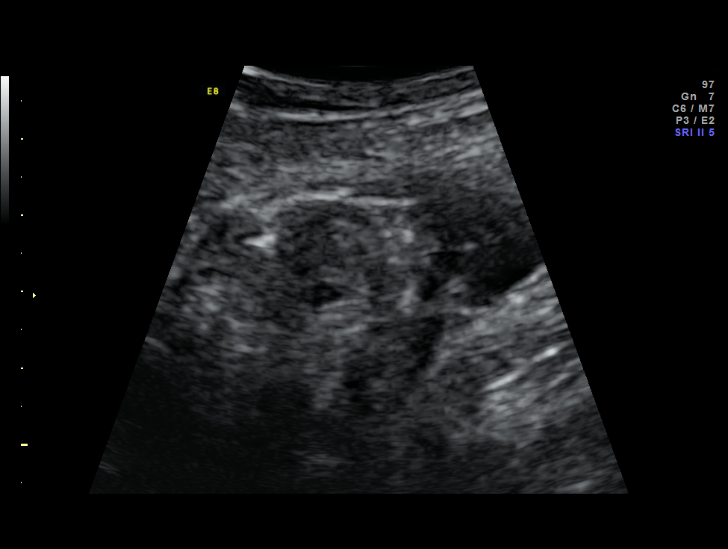
[im 91/107]
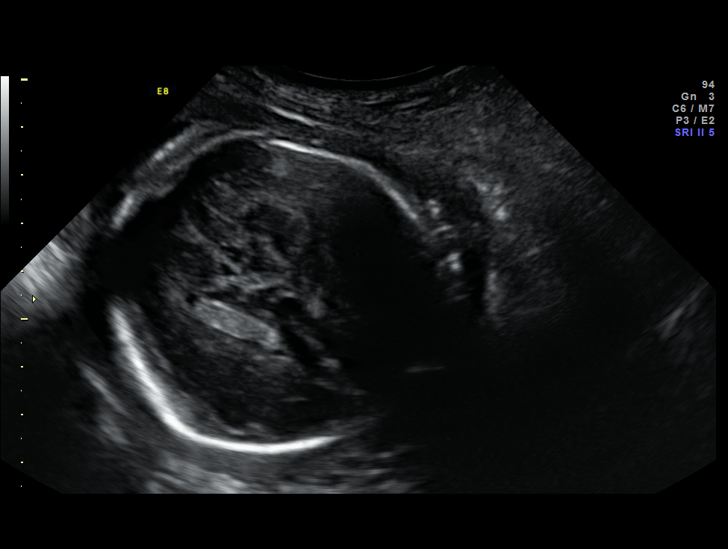
[im 99/107]
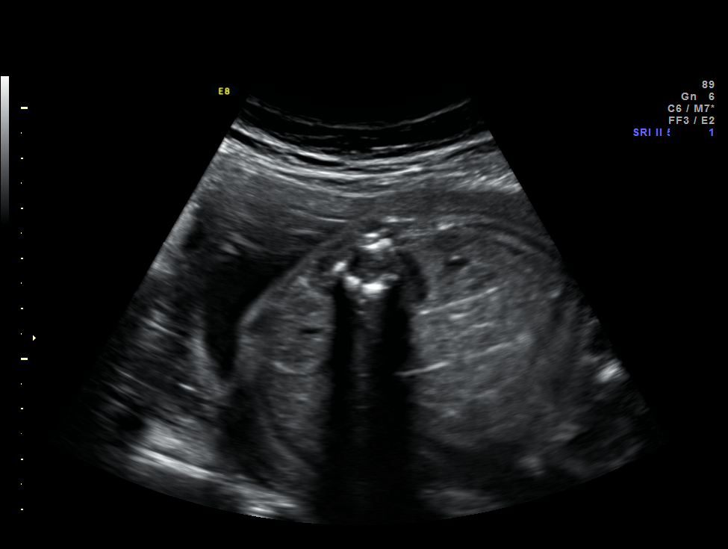
[im 107/107]
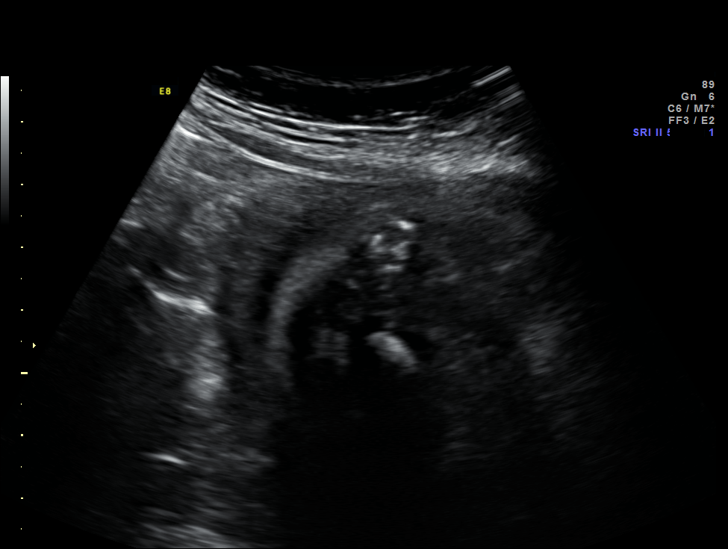

[14 of 28 positions shown; findings below may reference images not displayed]

Canned report from images found in remote index.

Refer to host system for actual result text.

## 2013-11-19 ENCOUNTER — Other Ambulatory Visit (HOSPITAL_COMMUNITY): Payer: Self-pay | Admitting: Urology

## 2013-11-19 DIAGNOSIS — Z8679 Personal history of other diseases of the circulatory system: Secondary | ICD-10-CM

## 2013-11-19 DIAGNOSIS — Z3689 Encounter for other specified antenatal screening: Secondary | ICD-10-CM

## 2013-11-19 LAB — OB RESULTS CONSOLE HEPATITIS B SURFACE ANTIGEN: HEP B S AG: NEGATIVE

## 2013-11-19 LAB — OB RESULTS CONSOLE GC/CHLAMYDIA
CHLAMYDIA, DNA PROBE: NEGATIVE
Gonorrhea: NEGATIVE

## 2013-11-19 LAB — OB RESULTS CONSOLE RPR: RPR: NONREACTIVE

## 2013-11-19 LAB — OB RESULTS CONSOLE HIV ANTIBODY (ROUTINE TESTING): HIV: NONREACTIVE

## 2013-11-20 ENCOUNTER — Other Ambulatory Visit (HOSPITAL_COMMUNITY): Payer: Medicaid Other

## 2013-12-18 ENCOUNTER — Encounter (HOSPITAL_COMMUNITY): Payer: Self-pay

## 2013-12-18 ENCOUNTER — Ambulatory Visit (HOSPITAL_COMMUNITY)
Admission: RE | Admit: 2013-12-18 | Discharge: 2013-12-18 | Disposition: A | Discharge status: Home / Self Care | Payer: Medicaid Other | Source: Ambulatory Visit | Attending: Family Medicine | Admitting: Family Medicine

## 2013-12-18 ENCOUNTER — Ambulatory Visit (HOSPITAL_COMMUNITY)
Admission: RE | Admit: 2013-12-18 | Discharge: 2013-12-18 | Disposition: A | Discharge status: Home / Self Care | Payer: Medicaid Other | Source: Ambulatory Visit | Attending: Nurse Practitioner | Admitting: Nurse Practitioner

## 2013-12-18 VITALS — BP 113/72 | HR 88 | Wt 100.5 lb

## 2013-12-18 DIAGNOSIS — Z8774 Personal history of (corrected) congenital malformations of heart and circulatory system: Secondary | ICD-10-CM

## 2013-12-18 DIAGNOSIS — Z3A21 21 weeks gestation of pregnancy: Secondary | ICD-10-CM

## 2013-12-18 DIAGNOSIS — Z8679 Personal history of other diseases of the circulatory system: Secondary | ICD-10-CM | POA: Insufficient documentation

## 2013-12-18 DIAGNOSIS — O9989 Other specified diseases and conditions complicating pregnancy, childbirth and the puerperium: Secondary | ICD-10-CM | POA: Insufficient documentation

## 2013-12-18 DIAGNOSIS — Z3689 Encounter for other specified antenatal screening: Secondary | ICD-10-CM

## 2013-12-20 DIAGNOSIS — Z8774 Personal history of (corrected) congenital malformations of heart and circulatory system: Secondary | ICD-10-CM | POA: Insufficient documentation

## 2013-12-20 NOTE — Progress Notes (Signed)
Genetic Counseling  High-Risk Gestation Note  Appointment Date:  12/18/2013 Referred By: Jolaine Click, NP Date of Birth:  1985-10-30 Partner:  Evon Slack     Pregnancy History: [id number] Estimated Date of Delivery: 05/23/14 Estimated Gestational Age: 63w5dAttending: MRenella Cunas MD   I met with [name] and her partner, Mr. TEvon Slack for genetic counseling because of a personal history of atrial septal defect for the patient. [place], but the couple communicated to [place] [employer] Genetic Counseling Intern, VSantiago Bumpers assisted with genetic counseling, under my direct supervision. Ms. RSalserwas seen for genetic counseling in a previous pregnancy on 01/22/11.  We began by updating the family history in detail. Ms. RRowlandspreviously reported that she had surgical [facility] [age][facility] She reported that this was not diagnosed until age [age]years [place] She is otherwise healthy. No additional relatives were reported with known congenital heart disease. The couple reported that their daughter is 247years old and is healthy. The couple reported no updates to the family history since the 2012 visit regarding birth defects, intellectual disability, or known genetic conditions.   Congenital heart defects occur in approximately 1% of pregnancies.  Congenital heart defects may occur due to multifactorial influences, chromosomal abnormalities, genetic syndromes or environmental exposures.  Isolated heart defects are generally multifactorial.  Given the reported family history of the mother of the pregnancy with an apparently isolated atrial septal defect and assuming multifactorial inheritance, the risk for a congenital heart defect in the current pregnancy is approximately 4-4.5%. Additional information regarding the etiology  for Ms. Cartmell's ASD may alter recurrence risk assessment. We discussed the options of targeted ultrasound and fetal echocardiogram to assess fetal growth and anatomy, and fetal heart in detail. The couple understands that prenatal ultrasound cannot diagnose or rule out all congenital heart disease or all birth defects.   Targeted ultrasound was performed at the time of today's visit. Complete ultrasound results reported separately. Visualized fetal anatomy appeared normal. Fetal echocardiogram is scheduled.   Ms. PClarke Amburndenied exposure to environmental toxins or chemical agents. She denied the use of alcohol, tobacco or street drugs. She denied significant viral illnesses during the course of her pregnancy. Her medical and surgical histories were contributory for surgical repair of atrial septal defect, as previously discussed.   I counseled this couple regarding the above risks and available options.  The approximate face-to-face time with the genetic counselor was 25 minutes.  KChipper Oman MS Certified Genetic Counselor 12/20/2013

## 2013-12-24 ENCOUNTER — Encounter (HOSPITAL_COMMUNITY): Payer: Self-pay

## 2014-01-01 ENCOUNTER — Other Ambulatory Visit (HOSPITAL_COMMUNITY): Payer: Self-pay

## 2014-01-08 ENCOUNTER — Other Ambulatory Visit (HOSPITAL_COMMUNITY): Payer: Self-pay

## 2014-02-22 NOTE — L&D Delivery Note (Signed)
Patient is 29 y.o. G2P1001 8035w3d admitted for active labor.  Delivery Note At 11:19 PM a healthy female was delivered via Vaginal, Spontaneous Delivery (Presentation: Right Occiput Anterior).  APGAR: 8,9. Placenta status: Intact, Spontaneous.  Cord: 3 vessels with the following complications: None.   Anesthesia: None  Episiotomy: None Lacerations: None Suture Repair: None Est. Blood Loss (mL): 200  Mom to postpartum.  Baby to Couplet care / Skin to Skin.  Feeding: Breast and bottle Pediatrician: Deciding Circumcision: n/a; girl Contraception: Depo provera  Mikeala Girdler 05/01/2014, 11:29 PM

## 2014-04-04 ENCOUNTER — Encounter: Payer: Self-pay | Admitting: Cardiology

## 2014-04-04 ENCOUNTER — Ambulatory Visit (INDEPENDENT_AMBULATORY_CARE_PROVIDER_SITE_OTHER): Payer: Medicaid Other | Admitting: Cardiology

## 2014-04-04 VITALS — BP 110/68 | HR 89 | Ht 60.0 in | Wt 118.0 lb

## 2014-04-04 DIAGNOSIS — Q2111 Secundum atrial septal defect: Secondary | ICD-10-CM

## 2014-04-04 DIAGNOSIS — Q211 Atrial septal defect: Secondary | ICD-10-CM

## 2014-04-04 NOTE — Patient Instructions (Signed)
Your physician has requested that you have an echocardiogram. Echocardiography is a painless test that uses sound waves to create images of your heart. It provides your doctor with information about the size and shape of your heart and how well your heart's chambers and valves are working. This procedure takes approximately one hour. There are no restrictions for this procedure.  Your physician wants you to follow-up in: 1 year with Dr. Turner. You will receive a reminder letter in the mail two months in advance. If you don't receive a letter, please call our office to schedule the follow-up appointment.  

## 2014-04-04 NOTE — Progress Notes (Signed)
Cardiology Office Note   Date:  04/04/2014   ID:  Sheila Torres, DOB 12-27-1985, MRN [id number]  PCP:  Sheila Albee, MD  Cardiologist:   Sheila Reichert, MD   Chief Complaint  Patient presents with  . Atrial Septal Defect      History of Present Illness: Sheila Torres is a 29 y.o. female who presents for evaluation of ASD.  She has a history of ASD with repair in 2009 and is now [redacted] weeks pregnant.  She has done well with her pregnancy.  She denies any chest pain or pressure, SOB, DOE, LE edema, palpitations or syncope.    Past Medical History  Diagnosis Date  . Atrial septal defect     had repair years ago  . Anemia   . Thalassemia   . Atrial septal defect   . Sickle cell anemia     a+ variant  . Anemia     Past Surgical History  Procedure Laterality Date  . Atrail septal defect repair    . Asd repair  2009     Current Outpatient Prescriptions  Medication Sig Dispense Refill  . Prenatal Vit-Fe Fumarate-FA (PRENATAL MULTIVITAMIN) TABS Take 1 tablet by mouth daily. 30 tablet 12   No current facility-administered medications for this visit.    Allergies:   Review of patient's allergies indicates no known allergies.    Social History:  The patient  reports that she has never smoked. She does not have any smokeless tobacco history on file. She reports that she does not drink alcohol or use illicit drugs.   Family History:  The patient's family history includes Hypertension in her mother. There is no history of Anesthesia problems.    ROS:  Please see the history of present illness.   Otherwise, review of systems are positive for none.   All other systems are reviewed and negative.    PHYSICAL EXAM: VS:  BP 110/68 mmHg  Pulse 89  Ht 5' (1.524 m)  Wt 118 lb (53.524 kg)  BMI 23.05 kg/m2  LMP 08/16/2013 , BMI Body mass index is 23.05 kg/(m^2). GEN: Well nourished, well developed, in no acute distress HEENT: normal Neck: no JVD, carotid bruits, or  masses Cardiac: RRR; no murmurs, rubs, or gallops,no edema  Respiratory:  clear to auscultation bilaterally, normal work of breathing GI: soft, nontender, nondistended, + BS MS: no deformity or atrophy Skin: warm and dry, no rash Neuro:  Strength and sensation are intact Psych: euthymic mood, full affect   EKG:  EKG is ordered today. The ekg ordered today demonstrates NSR with nonspecific T wave changes   Recent Labs: No results found for requested labs within last 365 days.    Lipid Panel No results found for: CHOL, TRIG, HDL, CHOLHDL, VLDL, LDLCALC, LDLDIRECT    Wt Readings from Last 3 Encounters:  04/04/14 118 lb (53.524 kg)  04/24/11 117 lb (53.071 kg)  12/22/07 86 lb (39.009 kg)        ASSESSMENT AND PLAN:  1.  ASD s/p repair 2009.  I will get a 2D echo to reassess since it has been some time 2.  37 week pregnancy - uncomplicated    Current medicines are reviewed at length with the patient today.  The patient does not have concerns regarding medicines.  The following changes have been made:  no change  Labs/ tests ordered today include: 2D echo     Disposition:   FU with me in 1 year   Signed,  Sheila ReichertURNER,TRACI R, MD  04/04/2014 2:43 PM    [facility] [address], [place], [place]  [address] Phone: [phone]; Fax: [phone]

## 2014-04-05 LAB — OB RESULTS CONSOLE GC/CHLAMYDIA
Chlamydia: NEGATIVE
GC PROBE AMP, GENITAL: NEGATIVE

## 2014-04-05 LAB — OB RESULTS CONSOLE GBS: STREP GROUP B AG: POSITIVE

## 2014-04-09 ENCOUNTER — Ambulatory Visit (HOSPITAL_COMMUNITY): Payer: Medicaid Other | Attending: Cardiology | Admitting: Radiology

## 2014-04-09 DIAGNOSIS — Q211 Atrial septal defect: Secondary | ICD-10-CM | POA: Insufficient documentation

## 2014-04-09 DIAGNOSIS — Q2111 Secundum atrial septal defect: Secondary | ICD-10-CM

## 2014-04-09 NOTE — Progress Notes (Signed)
Echocardiogram performed.  

## 2014-04-29 ENCOUNTER — Other Ambulatory Visit (HOSPITAL_COMMUNITY): Payer: Self-pay | Admitting: Urology

## 2014-04-29 DIAGNOSIS — O480 Post-term pregnancy: Secondary | ICD-10-CM

## 2014-04-30 ENCOUNTER — Ambulatory Visit (HOSPITAL_COMMUNITY): Payer: Medicaid Other

## 2014-05-01 ENCOUNTER — Inpatient Hospital Stay (HOSPITAL_COMMUNITY)
Admission: AD | Admit: 2014-05-01 | Discharge: 2014-05-03 | DRG: 775 | Disposition: A | Discharge status: Home / Self Care | Payer: Medicaid Other | Source: Ambulatory Visit | Attending: Obstetrics & Gynecology | Admitting: Obstetrics & Gynecology

## 2014-05-01 ENCOUNTER — Encounter (HOSPITAL_COMMUNITY): Payer: Self-pay

## 2014-05-01 DIAGNOSIS — O480 Post-term pregnancy: Principal | ICD-10-CM | POA: Diagnosis present

## 2014-05-01 DIAGNOSIS — O9902 Anemia complicating childbirth: Secondary | ICD-10-CM | POA: Diagnosis present

## 2014-05-01 DIAGNOSIS — O99824 Streptococcus B carrier state complicating childbirth: Secondary | ICD-10-CM

## 2014-05-01 DIAGNOSIS — Z3A40 40 weeks gestation of pregnancy: Secondary | ICD-10-CM | POA: Diagnosis present

## 2014-05-01 DIAGNOSIS — D5780 Other sickle-cell disorders without crisis: Secondary | ICD-10-CM | POA: Diagnosis present

## 2014-05-01 DIAGNOSIS — D569 Thalassemia, unspecified: Secondary | ICD-10-CM | POA: Diagnosis present

## 2014-05-01 DIAGNOSIS — Z23 Encounter for immunization: Secondary | ICD-10-CM

## 2014-05-01 LAB — CBC
HCT: 33.8 % — ABNORMAL LOW (ref 36.0–46.0)
HEMOGLOBIN: 10.5 g/dL — AB (ref 12.0–15.0)
MCH: 26.7 pg (ref 26.0–34.0)
MCHC: 31.1 g/dL (ref 30.0–36.0)
MCV: 86 fL (ref 78.0–100.0)
PLATELETS: 321 10*3/uL (ref 150–400)
RBC: 3.93 MIL/uL (ref 3.87–5.11)
RDW: 15.1 % (ref 11.5–15.5)
WBC: 13.1 10*3/uL — ABNORMAL HIGH (ref 4.0–10.5)

## 2014-05-01 MED ORDER — SODIUM CHLORIDE 0.9 % IV SOLN
2.0000 g | Freq: Once | INTRAVENOUS | Status: AC
  Administered 2014-05-01: 2 g via INTRAVENOUS
  Filled 2014-05-01: qty 2000

## 2014-05-01 MED ORDER — OXYTOCIN BOLUS FROM INFUSION
500.0000 mL | INTRAVENOUS | Status: DC
  Administered 2014-05-01: 500 mL via INTRAVENOUS

## 2014-05-01 MED ORDER — LIDOCAINE HCL (PF) 1 % IJ SOLN
30.0000 mL | INTRAMUSCULAR | Status: DC | PRN
  Filled 2014-05-01: qty 30

## 2014-05-01 MED ORDER — PENICILLIN G POTASSIUM 5000000 UNITS IJ SOLR
2.5000 10*6.[IU] | INTRAVENOUS | Status: DC
  Filled 2014-05-01 (×4): qty 2.5

## 2014-05-01 MED ORDER — OXYCODONE-ACETAMINOPHEN 5-325 MG PO TABS: 2.0000 | ORAL_TABLET | ORAL | Status: DC | PRN

## 2014-05-01 MED ORDER — ACETAMINOPHEN 325 MG PO TABS: 650.0000 mg | ORAL_TABLET | ORAL | Status: DC | PRN

## 2014-05-01 MED ORDER — LACTATED RINGERS IV SOLN
INTRAVENOUS | Status: DC
  Administered 2014-05-01: 22:00:00 via INTRAVENOUS

## 2014-05-01 MED ORDER — FLEET ENEMA 7-19 GM/118ML RE ENEM: 1.0000 | ENEMA | RECTAL | Status: DC | PRN

## 2014-05-01 MED ORDER — OXYCODONE-ACETAMINOPHEN 5-325 MG PO TABS: 1.0000 | ORAL_TABLET | ORAL | Status: DC | PRN

## 2014-05-01 MED ORDER — ONDANSETRON HCL 4 MG/2ML IJ SOLN: 4.0000 mg | Freq: Four times a day (QID) | INTRAMUSCULAR | Status: DC | PRN

## 2014-05-01 MED ORDER — CITRIC ACID-SODIUM CITRATE 334-500 MG/5ML PO SOLN: 30.0000 mL | ORAL | Status: DC | PRN

## 2014-05-01 MED ORDER — LACTATED RINGERS IV SOLN: 500.0000 mL | INTRAVENOUS | Status: DC | PRN

## 2014-05-01 MED ORDER — PENICILLIN G POTASSIUM 5000000 UNITS IJ SOLR
5.0000 10*6.[IU] | Freq: Once | INTRAVENOUS | Status: DC
  Filled 2014-05-01: qty 5

## 2014-05-01 MED ORDER — OXYTOCIN 40 UNITS IN LACTATED RINGERS INFUSION - SIMPLE MED
62.5000 mL/h | INTRAVENOUS | Status: DC
  Filled 2014-05-01: qty 1000

## 2014-05-01 NOTE — MAU Note (Signed)
Pt to be reexamined in one hour.

## 2014-05-01 NOTE — H&P (Signed)
  Maternal Medical History:  Reason for admission: Nausea.     Patient is 29 y.o. G2P1001 6045w3d, h/o ASD s/p 2009 repair, sickle cell A+ variant, here with complaints of active labor, beginning at noon today  +FM, denies LOF, VB, vaginal discharge.   OB History    Gravida Para Term Preterm AB TAB SAB Ectopic Multiple Living   2 1 1  0 0 0 0 0 0 1     Past Medical History  Diagnosis Date  . Atrial septal defect     had repair years ago  . Anemia   . Thalassemia   . Atrial septal defect   . Sickle cell anemia     a+ variant  . Anemia    Past Surgical History  Procedure Laterality Date  . Atrail septal defect repair    . Asd repair  2009   Family History: family history includes Hypertension in her mother. There is no history of Anesthesia problems. Social History:  reports that she has never smoked. She does not have any smokeless tobacco history on file. She reports that she does not drink alcohol or use illicit drugs.   Prenatal Transfer Tool  Maternal Diabetes: No Genetic Screening: Declined Maternal Ultrasounds/Referrals: Normal Fetal Ultrasounds or other Referrals:  None, Fetal echo wnl (performed for mom's hx of ASD) Maternal Substance Abuse:  No Significant Maternal Medications:  None Significant Maternal Lab Results:  Lab values include: Group B Strep positive Other Comments:  None  Review of Systems  Constitutional: Negative for fever and chills.  Eyes: Negative for blurred vision.  Respiratory: Negative for cough and shortness of breath.   Cardiovascular: Negative for leg swelling.  Gastrointestinal: Negative for nausea, vomiting, abdominal pain and diarrhea.  Genitourinary: Negative for dysuria and hematuria.  Neurological: Negative for headaches.    Dilation: 10 Effacement (%): 100 Station: +2, +1 Exam by:: Kshaw, CNM Height 5' (1.524 m), weight 54.432 kg (120 lb), last menstrual period 08/16/2013, unknown if currently breastfeeding. Exam Physical  Exam  Constitutional: She appears well-developed and well-nourished. She appears distressed.  HENT:  Head: Normocephalic and atraumatic.  Eyes: Conjunctivae are normal. Pupils are equal, round, and reactive to light.  Neck: Normal range of motion.  Cardiovascular: Normal rate.   Respiratory: Effort normal. No respiratory distress.  GI: Soft. She exhibits no distension. There is no tenderness.  Musculoskeletal: Normal range of motion. She exhibits no edema.  Neurological: She is alert. Coordination normal.  Skin: Skin is warm and dry. She is not diaphoretic.    Prenatal labs: ABO, Rh:   O pos Antibody:   neg Rubella:   Immune RPR:   unknown HBsAg:   neg HIV:    NR GBS: Positive (02/12 0000)   Assessment/Plan: A: Patient is 29 y.o. G2P1001 1545w3d here with complaints of active labor, GBS+  P: Admit for active labor, progressing normally     Expectant management     Ampicillin for GBS+  Feeding: Breast and bottle Pediatrician: Deciding Circumcision: n/a; girl Contraception: Depo provera  Henson,Amber 05/01/2014, 11:31 PM   I have seen and examined this patient and I agree with the above. [facility]. Anticipate SVD. SHAW, KIMBERLY CNM 1:40 AM 05/02/2014

## 2014-05-02 ENCOUNTER — Encounter (HOSPITAL_COMMUNITY): Payer: Self-pay | Admitting: Obstetrics

## 2014-05-02 LAB — TYPE AND SCREEN
ABO/RH(D): O POS
Antibody Screen: NEGATIVE

## 2014-05-02 LAB — RPR: RPR: NONREACTIVE

## 2014-05-02 LAB — ABO/RH: ABO/RH(D): O POS

## 2014-05-02 MED ORDER — DIPHENHYDRAMINE HCL 25 MG PO CAPS: 25.0000 mg | ORAL_CAPSULE | Freq: Four times a day (QID) | ORAL | Status: DC | PRN

## 2014-05-02 MED ORDER — DIBUCAINE 1 % RE OINT: 1.0000 "application " | TOPICAL_OINTMENT | RECTAL | Status: DC | PRN

## 2014-05-02 MED ORDER — ONDANSETRON HCL 4 MG/2ML IJ SOLN: 4.0000 mg | INTRAMUSCULAR | Status: DC | PRN

## 2014-05-02 MED ORDER — LANOLIN HYDROUS EX OINT: TOPICAL_OINTMENT | CUTANEOUS | Status: DC | PRN

## 2014-05-02 MED ORDER — PRENATAL MULTIVITAMIN CH
1.0000 | ORAL_TABLET | Freq: Every day | ORAL | Status: DC
  Administered 2014-05-02 – 2014-05-03 (×2): 1 via ORAL
  Filled 2014-05-02 (×2): qty 1

## 2014-05-02 MED ORDER — SIMETHICONE 80 MG PO CHEW: 80.0000 mg | CHEWABLE_TABLET | ORAL | Status: DC | PRN

## 2014-05-02 MED ORDER — TETANUS-DIPHTH-ACELL PERTUSSIS 5-2.5-18.5 LF-MCG/0.5 IM SUSP
0.5000 mL | Freq: Once | INTRAMUSCULAR | Status: AC
  Administered 2014-05-02: 0.5 mL via INTRAMUSCULAR

## 2014-05-02 MED ORDER — WITCH HAZEL-GLYCERIN EX PADS: 1.0000 "application " | MEDICATED_PAD | CUTANEOUS | Status: DC | PRN

## 2014-05-02 MED ORDER — ONDANSETRON HCL 4 MG PO TABS: 4.0000 mg | ORAL_TABLET | ORAL | Status: DC | PRN

## 2014-05-02 MED ORDER — BENZOCAINE-MENTHOL 20-0.5 % EX AERO
1.0000 "application " | INHALATION_SPRAY | CUTANEOUS | Status: DC | PRN
  Administered 2014-05-02 – 2014-05-03 (×2): 1 via TOPICAL
  Filled 2014-05-02 (×2): qty 56

## 2014-05-02 MED ORDER — PNEUMOCOCCAL VAC POLYVALENT 25 MCG/0.5ML IJ INJ
0.5000 mL | INJECTION | INTRAMUSCULAR | Status: DC
  Filled 2014-05-02: qty 0.5

## 2014-05-02 MED ORDER — OXYCODONE-ACETAMINOPHEN 5-325 MG PO TABS
1.0000 | ORAL_TABLET | Freq: Four times a day (QID) | ORAL | Status: DC | PRN
  Administered 2014-05-02 (×2): 1 via ORAL
  Filled 2014-05-02 (×2): qty 1

## 2014-05-02 MED ORDER — IBUPROFEN 600 MG PO TABS
600.0000 mg | ORAL_TABLET | Freq: Four times a day (QID) | ORAL | Status: DC
  Administered 2014-05-02 – 2014-05-03 (×7): 600 mg via ORAL
  Filled 2014-05-02 (×7): qty 1

## 2014-05-02 MED ORDER — SENNOSIDES-DOCUSATE SODIUM 8.6-50 MG PO TABS
2.0000 | ORAL_TABLET | ORAL | Status: DC
  Administered 2014-05-02 (×2): 2 via ORAL
  Filled 2014-05-02 (×2): qty 2

## 2014-05-02 MED ORDER — ZOLPIDEM TARTRATE 5 MG PO TABS: 5.0000 mg | ORAL_TABLET | Freq: Every evening | ORAL | Status: DC | PRN

## 2014-05-02 NOTE — Progress Notes (Deleted)
Post Partum Day 1 s/p SVD, no laceration during delivery Admitted in active labor Subjective: no complaints, up ad lib, voiding and tolerating PO  Objective: Blood pressure 113/66, pulse 89, temperature 97.9 F (36.6 C), temperature source Oral, resp. rate 16, height 5' (1.524 m), weight 54.432 kg (120 lb), last menstrual period 08/16/2013, SpO2 99 %, unknown if currently breastfeeding.  Physical Exam:  General: alert, cooperative and no distress Lochia: appropriate Uterine Fundus: firm Incision: none DVT Evaluation: No evidence of DVT seen on physical exam.   Recent Labs  05/01/14 2220  HGB 10.5*  HCT 33.8*    Assessment/Plan: Plan for discharge tomorrow, Breastfeeding and Contraception Nexplanon outpatient   LOS: 1 day   Zackory Pudlo 05/02/2014, 9:03 AM    

## 2014-05-02 NOTE — Progress Notes (Signed)
Post Partum Day 1 s/p SVD, no laceration during delivery Admitted in active labor Subjective: no complaints, up ad lib, voiding and tolerating PO  Objective: Blood pressure 113/66, pulse 89, temperature 97.9 F (36.6 C), temperature source Oral, resp. rate 16, height 5' (1.524 m), weight 54.432 kg (120 lb), last menstrual period 08/16/2013, SpO2 99 %, unknown if currently breastfeeding.  Physical Exam:  General: alert, cooperative and no distress Lochia: appropriate Uterine Fundus: firm Incision: none DVT Evaluation: No evidence of DVT seen on physical exam.   Recent Labs  05/01/14 2220  HGB 10.5*  HCT 33.8*    Assessment/Plan: Plan for discharge tomorrow, Breastfeeding and Contraception Nexplanon outpatient   LOS: 1 day   Sheila Torres 05/02/2014, 9:03 AM

## 2014-05-02 NOTE — Lactation Note (Signed)
This note was copied from the chart of Sheila Torres. Lactation Consultation Note  Patient Name: Sheila Torres YNWGN'FToday's Date: 05/02/2014 Reason for consult: Follow-up assessment;Hyperbilirubinemia Baby 15 hours of life. Mom states that she gave first baby formula and breast and this is her plan with this baby. Discussed supply and demand. Mom states that her milk came in well with first baby. Enc mom to have baby at breast often to keep milk moving and prevent engorgement. FOB asking for more formula, and mom asked for a hand pump, both were brought to room. Mom has lots of visitors, including young children, and sibling of baby is crying. Offered to assist with latching the baby, but mom declined. Enc mom to call out for assistance with latching.   Maternal Data    Feeding Feeding Type: Bottle Fed - Formula Nipple Type: Slow - flow  LATCH Score/Interventions                      Lactation Tools Discussed/Used     Consult Status Consult Status: PRN    Geralynn OchsWILLIARD, Audray Rumore 05/02/2014, 3:04 PM

## 2014-05-02 NOTE — Progress Notes (Signed)
UR chart review completed.  

## 2014-05-02 NOTE — Lactation Note (Signed)
This note was copied from the chart of Sheila Agnieszka Hitchcock. Lactation Consultation Note Experienced mom, tried to BF with her first child but couldn't get baby to latch, so she pumped and bottle fed baby. Mom concerned that she doesn't have anything d/c she is unable to get colostrum. Hand expression taught. Didn't see colostrum. So mom pumped and bottle fed her colostrum and breast milk. Explained colostrum and stimulating breast.  Mom encouraged to feed baby 8-12 times/24 hours and with feeding cues.  Educated about newborn behavior. Encouraged to call for assistance if needed and to verify proper latch.Referred to Baby and Me Book in Breastfeeding section Pg. 22-23 for position options and Proper latch demonstration.[facility]/LC brochure given w/resources, support groups and LC services. Mom given shells and encouraged to wear them between feedings.   Patient Name: Sheila Torres ZOXWR'UToday's Date: 05/02/2014 Reason for consult: Initial assessment   Maternal Data Has patient been taught Hand Expression?: Yes Does the patient have breastfeeding experience prior to this delivery?: Yes  Feeding Feeding Type: Breast Fed Length of feed: 30 min  LATCH Score/Interventions       Type of Nipple: Everted at rest and after stimulation  Comfort (Breast/Nipple): Soft / non-tender           Lactation Tools Discussed/Used     Consult Status Consult Status: Follow-up Date: 05/02/14 (in pm) Follow-up type: In-patient    Charyl DancerCARVER, Donnae Michels G 05/02/2014, 6:39 AM

## 2014-05-03 ENCOUNTER — Ambulatory Visit (HOSPITAL_COMMUNITY)
Admission: RE | Admit: 2014-05-03 | Discharge: 2014-05-03 | Disposition: A | Discharge status: Home / Self Care | Payer: Medicaid Other | Source: Ambulatory Visit | Attending: Urology | Admitting: Urology

## 2014-05-03 MED ORDER — ACETAMINOPHEN 325 MG PO TABS: 650.0000 mg | ORAL_TABLET | ORAL | Status: DC | PRN

## 2014-05-03 MED ORDER — IBUPROFEN 600 MG PO TABS: 600.0000 mg | ORAL_TABLET | Freq: Four times a day (QID) | ORAL | Status: DC

## 2014-05-03 MED ORDER — OXYCODONE-ACETAMINOPHEN 5-325 MG PO TABS: 10.0000 | ORAL_TABLET | Freq: Four times a day (QID) | ORAL | Status: DC | PRN

## 2014-05-03 NOTE — Discharge Summary (Signed)
Obstetric Discharge Summary Reason for Admission: onset of labor Prenatal Procedures: none Intrapartum Procedures: spontaneous vaginal delivery Postpartum Procedures: none Complications-Operative and Postpartum: none HEMOGLOBIN  Date Value Ref Range Status  05/01/2014 10.5* 12.0 - 15.0 g/dL Final   HCT  Date Value Ref Range Status  05/01/2014 33.8* 36.0 - 46.0 % Final    Physical Exam:  General: alert, cooperative and no distress Lochia: appropriate Uterine Fundus: firm Incision: no laceration DVT Evaluation: No evidence of DVT seen on physical exam.  Discharge Diagnoses: Term Pregnancy-delivered  Discharge Information: Date: 05/03/2014 Activity: pelvic rest Diet: routine Medications: Ibuprofen and Percocet Condition: stable Instructions: refer to practice specific booklet Discharge to: home Follow-up Information    [facility] Schedule an appointment as soon as possible for a visit in 6 weeks.   Why:  Or sooner if needed   Contact information:   [address] [place] [id number] [phone]      Newborn Data: Live born female  Birth Weight: 7 lb 9.2 oz (3435 g) APGAR: 8, 9  Breast and bottle feeding Contraception plan: depo provera  Henson,Amber 05/03/2014, 8:55 AM   I have seen and examined this patient and agree the above assessment. CRESENZO-DISHMAN,Levita Monical 05/03/2014 6:46 PM

## 2014-05-03 NOTE — Progress Notes (Signed)
Post Partum Day 2 s/p SVD, no laceration during delivery Admitted in active labor  Subjective: no complaints, up ad lib, voiding and tolerating PO  Objective: Blood pressure 113/66, pulse 89, temperature 97.9 F (36.6 C), temperature source Oral, resp. rate 16, height 5' (1.524 m), weight 54.432 kg (120 lb), last menstrual period 08/16/2013, SpO2 99 %  Physical Exam:  General: alert, cooperative and no distress Lochia: appropriate Uterine Fundus: firm Incision: none DVT Evaluation: No evidence of DVT seen on physical exam Vaginal exam: swollen labia minora b/l, no bleeding or lacerations  Assessment/Plan: Plan for discharge tomorrow, breastfeeding and bottle-feeding, contraception Nexplanon outpatient Given inadequate tx of GBS, pediatrician to keep baby until 05/04/2014.   LOS: 1 day  I have seen and examined this patient and agree the above assessment. CRESENZO-DISHMAN,Joannie Medine 05/03/2014 6:45 PM

## 2014-05-04 ENCOUNTER — Other Ambulatory Visit: Payer: Self-pay | Admitting: Obstetrics & Gynecology

## 2014-05-04 MED ORDER — OXYCODONE-ACETAMINOPHEN 5-325 MG PO TABS: 1.0000 | ORAL_TABLET | Freq: Four times a day (QID) | ORAL | Status: DC | PRN

## 2014-05-07 ENCOUNTER — Ambulatory Visit (HOSPITAL_COMMUNITY): Payer: Medicaid Other

## 2014-05-08 ENCOUNTER — Inpatient Hospital Stay (HOSPITAL_COMMUNITY): Admission: RE | Admit: 2014-05-08 | Payer: Medicaid Other | Source: Ambulatory Visit

## 2015-05-09 ENCOUNTER — Encounter: Payer: Medicaid Other | Admitting: Family Medicine

## 2019-02-23 NOTE — L&D Delivery Note (Signed)
Delivery Note At 2:07 AM a viable and healthy female was delivered via Vaginal, Spontaneous (Presentation: Left Occiput Anterior).  APGAR: 8, 9; weight pending.   Placenta status: Spontaneous, Intact.  Cord: 3 vessels with the following complications: none .    Anesthesia: None Episiotomy: None Lacerations: None Suture Repair: n/a Est. Blood Loss (mL): 100  Mom to postpartum.  Baby to Couplet care / Skin to Skin.   Sheila Torres is a 34 y.o. female G3P2002 with IUP at [redacted]w[redacted]d admitted for active labor at term .  She progressed with AROM for augmentation to complete and pushed less than 20 minutes to deliver.  Cord clamping delayed by 1-3 minutes then clamped by CNM and cut by FOB.  Placenta intact and spontaneous, bleeding minimal.  Intact perineum without repair.  Mom and baby stable prior to transfer to postpartum. She plans on breastfeeding.   Sheila Torres 10/29/2019, 2:43 AM

## 2019-07-02 ENCOUNTER — Ambulatory Visit: Payer: Medicaid Other | Attending: Obstetrics & Gynecology | Admitting: Genetic Counselor

## 2019-07-02 ENCOUNTER — Other Ambulatory Visit: Payer: Self-pay

## 2019-07-02 ENCOUNTER — Other Ambulatory Visit: Payer: Self-pay | Admitting: *Deleted

## 2019-07-02 DIAGNOSIS — D582 Other hemoglobinopathies: Secondary | ICD-10-CM

## 2019-07-02 DIAGNOSIS — Q211 Atrial septal defect, unspecified: Secondary | ICD-10-CM

## 2019-07-02 DIAGNOSIS — Z315 Encounter for genetic counseling: Secondary | ICD-10-CM | POA: Diagnosis not present

## 2019-07-02 DIAGNOSIS — Z8279 Family history of other congenital malformations, deformations and chromosomal abnormalities: Secondary | ICD-10-CM

## 2019-07-02 DIAGNOSIS — Z8249 Family history of ischemic heart disease and other diseases of the circulatory system: Secondary | ICD-10-CM

## 2019-07-02 NOTE — Progress Notes (Signed)
07/02/2019  Sheila Torres [birth year] MRN: [id number] DOV: 07/02/2019  Sheila Torres presented to the [facility] for a genetics consultation regarding her personal history of a congenital heart defect. Sheila Torres came to her appointment alone due to COVID-19 visitor restrictions. This session was facilitated by [employer] [place] interpreter [place], [place] #[phone].   Indication for genetic counseling - Personal history of atrial septal defect  Prenatal history  Sheila Torres is a G3P2002, 34 y.o. female. Her current pregnancy has completed [redacted]w[redacted]d (Estimated Date of Delivery: 11/19/19). Sheila Torres and her husband have an 62 year old daughter and a 13 year old daughter, both of whom are healthy.  Ms. Tuckett denied exposure to environmental toxins or chemical agents. She denied the use of alcohol, tobacco or street drugs. She reported taking prenatal vitamins and iron for anemia. She denied significant viral illnesses, fevers, and bleeding during the course of her pregnancy. Sheila Torres underwent surgical repair of her congenital heart defect at age 40. Her medical and surgical histories were otherwise noncontributory.  Family History  A three generation pedigree was drafted and reviewed. The family history is remarkable for the following:  - Sheila Torres has a personal history of an atrial septal defect for which she underwent surgical repair at age 4. She is otherwise healthy. Sheila Torres does not have any other relatives with a congenital heart defect. See Discussion section for more details.  - Sheila Torres had a brother who reportedly died early in life as he was "sick". She did not have any further details about her brother. For this reason, risk assessment was limited.  The remaining family histories were reviewed and found to be noncontributory for birth defects, intellectual disability, recurrent pregnancy loss, and known genetic conditions. Sheila Torres had limited information about some of her and  her partner's family histories; thus, risk assessment was limited.  The patient's ethnicity is [place] The father of the pregnancy's ethnicity is [place] Ashkenazi Jewish ancestry and consanguinity were denied. Pedigree will be scanned under Media.  Discussion  Atrial septal defect:  Sheila Torres was referred for genetic counseling to discuss her personal history of an atrial septal defect (ASD). We discussed that ASDs are a form of a congenital heart defect (CHD). We reviewed that CHDs can be isolated or a feature of an underlying genetic condition. CHDs are most often multifactorial in etiology, but can also result from chromosome aberrations, single gene conditions, or teratogenic exposures. We discussed that isolated, nonsyndromic CHDs occur in approximately 1% of the general population. If Sheila Torres's ASD was isolated, the chance of recurrence for any CHD in her children is expected to be 4-4.5%. If, however, she has an underlying genetic condition that caused her ASD, the chance of recurrence for a CHD in her children could be increased. In this case, the specific chance of recurrence depends on the inheritance of the condition. We discussed that a detailed anatomy ultrasound and fetal echocardiogram to assess the development of the fetus's heart are recommended.  Aneuploidy screening/diagnostic testing:  We reviewed noninvasive prenatal screening (NIPS) as an available screening option for chromosomal aneuploidies. Specifically, we discussed that NIPS analyzes cell free DNA originating from the placenta that is found in the maternal blood circulation during pregnancy. This test is not diagnostic for chromosome conditions, but can provide information regarding the presence or absence of extra fetal DNA for chromosomes 13, 18, 21, and the sex chromosomes. Thus, it would not identify or rule out all  fetal aneuploidy. The reported detection rate is 91-99% for trisomies 21, 18, 13, and sex  chromosome aneuploidies. The false positive rate is reported to be less than 0.1% for any of these conditions.   Sheila Torres was also counseled regarding diagnostic testing via amniocentesis. We discussed the technical aspects of the procedure and quoted up to a 1 in 500 (0.2%) risk for spontaneous pregnancy loss or other adverse pregnancy outcomes as a result of amniocentesis. Cultured cells from an amniocentesis sample allow for the visualization of a fetal karyotype, which can detect >99% of chromosomal aberrations. Sheila Torres was informed that diagnostic testing is the only way to definitively determine if a fetus is affected by a chromosomal abnormality prenatally.  Sheila Torres indicated that she is not interested in undergoing NIPS or amniocentesis. She would prefer to continue monitoring the pregnancy via standard ultrasounds.  Carrier screening:  Sheila Torres had elevated hemoglobin A2 levels on hemoglobin electrophoresis, suggesting that she may be a carrier for a condition such as beta-thalassemia. Elevated A2 levels can also be seen in individuals with megaloblastic anemia and hyperthyroidism. Sheila Torres has had previous CBCs with normal MCV and MCH values. Given her ethnicity and her CBC and hemoglobin electrophoresis results, it is possible that Sheila Torres may carry beta-thalassemia trait, possibly co-inherited with alpha-thalassemia trait.   Both alpha- and beta-thalassemia affect the development of hemoglobin in red blood cells. Hemoglobin is a protein that transports oxygen from the lungs to organs and tissues throughout the body. Thalassemias are classified by the globin genes that are mutated (for example, alpha vs beta globin) and by the severity of disease. The major adult form of hemoglobin (Hb A) is composed of two alpha and two beta globin chains. In alpha-thalassemia, alpha globin expression is deficient and there is a corresponding excess of beta globin chains. In beta-thalassemia, beta  globin expression is deficient and there is a corresponding excess of alpha globin chains.  Alpha- and beta-thalassemia are distinguished by the amount of the minor adult hemoglobin (Hb A2) present. Hb A2 is composed of two alpha and two delta globin chains. Hb A2 is increased in beta-thalassemia because the relative lack of beta globin allows more delta chains to be incorporated into hemoglobin.   MCV and MCH values are often decreased on CBC in individuals who carry alpha- or beta-thalassemia trait. However, when a patient is a carrier for both alpha- and beta-thalassemia, their MCV and MCH values often normalize, because alpha and beta globin chains are present in relatively balanced amounts in red blood cells.   Beta-thalassemia is classified into two subtypes depending on the severity of an individual's symptoms. Thalassemia major, AKA Cooley's anemia, is the more severe subtype. Children with thalassemia major develop life-threatening anemia within the first two years of life. Symptoms in children with thalassemia major may include failure to thrive, jaundice, hepatosplenomegaly (enlarged liver and spleen), cardiomegaly (enlarged heart), thalassemia-related bony changes, and delayed puberty. Individuals with thalassemia major often require frequent blood transfusions to replenish their supply of red blood cells. Over time, an influx of iron-containing hemoglobin from chronic blood transfusions can lead to iron overload in the body, resulting in liver, heart, and hormone problems in affected individuals. Thalassemia intermedia is the milder subtype of beta-thalassemia. Onset of thalassemia intermedia symptoms occurs in early childhood or later in life. Affected individuals may experience mild to moderate anemia, slow growth, and thalassemia-related bony changes.    Beta-thalassemia is caused by changes in the HBB gene. The HBB gene  provides instructions for making beta globin. Some pathogenic variants  in the HBB gene prevent the production of any beta globin in the body. The condition caused by the absence of beta globin is referred to as beta-zero (?0) thalassemia. Other pathogenic variants in the HBB gene allow for the production of a reduced amount of beta globin in the body. The condition caused by a reduced amount of beta globin is referred to as beta-plus (?+) thalassemia. A deficiency or complete lack of beta globin leads to a reduced amount of functional hemoglobin in the body, causing a shortage of mature red blood cells. This results in the features associated with beta-thalassemia. The amount of beta globin that is produced in the body does not necessarily predict disease severity, as individuals with both ?0 and ?+ have been diagnosed with both clinical subtypes of beta-thalassemia.  Alpha-thalassemia is different in its inheritance compared to other hemoglobinopathies as there are two copies of two alpha globin genes (HBA1 and HBA2) on each chromosome 16, or four alpha globin genes total (aa/aa). A person can be a carrier of one alpha gene mutation (aa/a-), also referred to as a "silent carrier". A person who carries two alpha globin gene mutations can either carry them in cis (both on the same chromosome, denoted as aa/--) or in trans (on different chromosomes, denoted as a-/a-). Alpha-thalassemia carriers of two mutations who have [place] ancestry are more likely to have a cis arrangement (aa/--).     There are several different forms of alpha-thalassemia. The most severe form of alpha-thalassemia, Hb Barts, is associated with an absence of alpha globin chain synthesis as a result of deletions of all four alpha globin genes (--/--).  Hemoglobin H (HbH) disease is caused by three deleted or dysfunctioning alpha globin alleles (a-/--) and is characterized by microcytic hypochromic hemolytic anemia, hepatosplenomegaly, mild jaundice, growth retardation, and sometimes thalassemia-like bone  changes.    Both alpha- and beta-thalassemia are inherited in an autosomal recessive pattern. To determine if Sheila Torres is a carrier for alpha- and/or beta-thalassemia, we discussed that it is recommended that she undergo carrier screening for the HBA1, HBA2, and HBB genes. If she is identified as a carrier, partner carrier screening for alpha- and beta-thalassemia would be recommended for Sheila Torres's husband. This would help clarify which, if any, thalassemias their children could be affected by. Sheila Torres wished to discuss carrier screening with her husband before making a decision about undergoing testing.  Plan:  Aneuploidy screening and diagnostic testing were declined today. She understands that screening, including ultrasound, cannot rule out all birth defects or genetic syndromes. Sheila Torres will discuss carrier screening for alpha- and beta- thalassemia with her husband and contact me if she is interested in pursuing testing.  I counseled Sheila Torres regarding the above risks and available options. The approximate face-to-face time with the genetic counselor was 30 minutes.  In summary:  Discussed personal history of atrial septal defect (ASD)  4-4.5% chance of recurrence for any congenital heart defect in her children if ASD is isolated  Recommend anatomy scan and fetal echocardiogram. We will place the orders for these referrals  Discussed hemoglobin electrophoresis results  Elevated hemoglobin A2 levels  May be carrier for beta- and/or alpha-thalassemia. Recommend carrier screening of HBA1/2 & HBB genes. Patient will contact me if interested  Offered additional testing and screening  Declined NIPS and amniocentesis  Prefers to continue monitoring pregnancy via ultrasound  Reviewed family history concerns   Gershon Crane,  MS, Counselling psychologist

## 2019-07-16 ENCOUNTER — Encounter: Payer: Self-pay | Admitting: Genetic Counselor

## 2019-07-16 ENCOUNTER — Ambulatory Visit: Payer: Self-pay | Admitting: Genetic Counselor

## 2019-07-24 ENCOUNTER — Encounter: Payer: Self-pay | Admitting: *Deleted

## 2019-07-26 ENCOUNTER — Other Ambulatory Visit: Payer: Self-pay | Admitting: *Deleted

## 2019-07-26 ENCOUNTER — Ambulatory Visit: Payer: Medicaid Other | Attending: Obstetrics and Gynecology

## 2019-07-26 ENCOUNTER — Other Ambulatory Visit: Payer: Self-pay

## 2019-07-26 ENCOUNTER — Ambulatory Visit: Payer: Medicaid Other | Admitting: *Deleted

## 2019-07-26 VITALS — BP 117/67 | HR 90

## 2019-07-26 DIAGNOSIS — Z363 Encounter for antenatal screening for malformations: Secondary | ICD-10-CM

## 2019-07-26 DIAGNOSIS — Z3A26 26 weeks gestation of pregnancy: Secondary | ICD-10-CM | POA: Diagnosis not present

## 2019-07-26 DIAGNOSIS — Z8774 Personal history of (corrected) congenital malformations of heart and circulatory system: Secondary | ICD-10-CM | POA: Insufficient documentation

## 2019-07-26 DIAGNOSIS — Z3687 Encounter for antenatal screening for uncertain dates: Secondary | ICD-10-CM | POA: Diagnosis not present

## 2019-07-26 DIAGNOSIS — O2692 Pregnancy related conditions, unspecified, second trimester: Secondary | ICD-10-CM | POA: Diagnosis not present

## 2019-07-26 DIAGNOSIS — Z862 Personal history of diseases of the blood and blood-forming organs and certain disorders involving the immune mechanism: Secondary | ICD-10-CM

## 2019-07-26 DIAGNOSIS — O99012 Anemia complicating pregnancy, second trimester: Secondary | ICD-10-CM | POA: Diagnosis not present

## 2019-07-26 DIAGNOSIS — Z8279 Family history of other congenital malformations, deformations and chromosomal abnormalities: Secondary | ICD-10-CM

## 2019-07-26 DIAGNOSIS — D573 Sickle-cell trait: Secondary | ICD-10-CM | POA: Insufficient documentation

## 2019-07-26 DIAGNOSIS — O35BXX0 Maternal care for other (suspected) fetal abnormality and damage, fetal cardiac anomalies, not applicable or unspecified: Secondary | ICD-10-CM

## 2019-07-26 DIAGNOSIS — Z362 Encounter for other antenatal screening follow-up: Secondary | ICD-10-CM

## 2019-08-28 ENCOUNTER — Encounter: Payer: Self-pay | Admitting: Pediatric Cardiology

## 2019-08-30 ENCOUNTER — Other Ambulatory Visit: Payer: Self-pay

## 2019-08-30 ENCOUNTER — Ambulatory Visit: Payer: Medicaid Other | Admitting: *Deleted

## 2019-08-30 ENCOUNTER — Ambulatory Visit: Payer: Medicaid Other | Attending: Obstetrics and Gynecology

## 2019-08-30 VITALS — BP 99/63 | HR 97

## 2019-08-30 DIAGNOSIS — Z3A31 31 weeks gestation of pregnancy: Secondary | ICD-10-CM

## 2019-08-30 DIAGNOSIS — O0990 Supervision of high risk pregnancy, unspecified, unspecified trimester: Secondary | ICD-10-CM | POA: Diagnosis present

## 2019-08-30 DIAGNOSIS — Z862 Personal history of diseases of the blood and blood-forming organs and certain disorders involving the immune mechanism: Secondary | ICD-10-CM

## 2019-08-30 DIAGNOSIS — O2693 Pregnancy related conditions, unspecified, third trimester: Secondary | ICD-10-CM

## 2019-08-30 DIAGNOSIS — Z362 Encounter for other antenatal screening follow-up: Secondary | ICD-10-CM | POA: Insufficient documentation

## 2019-10-29 ENCOUNTER — Other Ambulatory Visit: Payer: Self-pay

## 2019-10-29 ENCOUNTER — Encounter (HOSPITAL_COMMUNITY): Payer: Self-pay | Admitting: Obstetrics and Gynecology

## 2019-10-29 ENCOUNTER — Inpatient Hospital Stay (HOSPITAL_COMMUNITY)
Admission: AD | Admit: 2019-10-29 | Discharge: 2019-10-31 | DRG: 807 | Disposition: A | Discharge status: Home / Self Care | Payer: Medicaid Other | Attending: Obstetrics and Gynecology | Admitting: Obstetrics and Gynecology

## 2019-10-29 DIAGNOSIS — R030 Elevated blood-pressure reading, without diagnosis of hypertension: Secondary | ICD-10-CM | POA: Diagnosis present

## 2019-10-29 DIAGNOSIS — D573 Sickle-cell trait: Secondary | ICD-10-CM | POA: Diagnosis present

## 2019-10-29 DIAGNOSIS — O9902 Anemia complicating childbirth: Secondary | ICD-10-CM | POA: Diagnosis present

## 2019-10-29 DIAGNOSIS — Z20822 Contact with and (suspected) exposure to covid-19: Secondary | ICD-10-CM | POA: Diagnosis present

## 2019-10-29 DIAGNOSIS — Z3A40 40 weeks gestation of pregnancy: Secondary | ICD-10-CM | POA: Diagnosis not present

## 2019-10-29 DIAGNOSIS — O1490 Unspecified pre-eclampsia, unspecified trimester: Secondary | ICD-10-CM

## 2019-10-29 DIAGNOSIS — O164 Unspecified maternal hypertension, complicating childbirth: Secondary | ICD-10-CM | POA: Diagnosis present

## 2019-10-29 LAB — COMPREHENSIVE METABOLIC PANEL
ALT: 18 U/L (ref 0–44)
AST: 28 U/L (ref 15–41)
Albumin: 3 g/dL — ABNORMAL LOW (ref 3.5–5.0)
Alkaline Phosphatase: 181 U/L — ABNORMAL HIGH (ref 38–126)
Anion gap: 11 (ref 5–15)
BUN: 10 mg/dL (ref 6–20)
CO2: 21 mmol/L — ABNORMAL LOW (ref 22–32)
Calcium: 9.1 mg/dL (ref 8.9–10.3)
Chloride: 102 mmol/L (ref 98–111)
Creatinine, Ser: 0.53 mg/dL (ref 0.44–1.00)
GFR calc Af Amer: >60 mL/min (ref >60)
GFR calc non Af Amer: >60 mL/min (ref >60)
Glucose, Bld: 83 mg/dL (ref 70–99)
Potassium: 3.8 mmol/L (ref 3.5–5.1)
Sodium: 134 mmol/L — ABNORMAL LOW (ref 135–145)
Total Bilirubin: 1.7 mg/dL — ABNORMAL HIGH (ref 0.3–1.2)
Total Protein: 7.1 g/dL (ref 6.5–8.1)

## 2019-10-29 LAB — PROTEIN / CREATININE RATIO, URINE
Creatinine, Urine: 27.7 mg/dL
Protein Creatinine Ratio: 0.61 mg/mg{Cre} — ABNORMAL HIGH (ref 0.00–0.15)
Total Protein, Urine: 17 mg/dL

## 2019-10-29 LAB — CBC
HCT: 36.6 % (ref 36.0–46.0)
Hemoglobin: 11 g/dL — ABNORMAL LOW (ref 12.0–15.0)
MCH: 27 pg (ref 26.0–34.0)
MCHC: 30.1 g/dL (ref 30.0–36.0)
MCV: 89.7 fL (ref 80.0–100.0)
Platelets: 313 10*3/uL (ref 150–400)
RBC: 4.08 MIL/uL (ref 3.87–5.11)
RDW: 15.2 % (ref 11.5–15.5)
WBC: 9.7 10*3/uL (ref 4.0–10.5)
nRBC: 2.1 % — ABNORMAL HIGH (ref 0.0–0.2)

## 2019-10-29 LAB — SARS CORONAVIRUS 2 BY RT PCR (HOSPITAL ORDER, PERFORMED IN ~~LOC~~ HOSPITAL LAB): SARS Coronavirus 2: NEGATIVE

## 2019-10-29 LAB — TYPE AND SCREEN
ABO/RH(D): O POS
Antibody Screen: NEGATIVE

## 2019-10-29 LAB — RPR: RPR Ser Ql: NONREACTIVE

## 2019-10-29 MED ORDER — DIBUCAINE (PERIANAL) 1 % EX OINT: 1.0000 "application " | TOPICAL_OINTMENT | CUTANEOUS | Status: DC | PRN

## 2019-10-29 MED ORDER — BENZOCAINE-MENTHOL 20-0.5 % EX AERO: 1.0000 "application " | INHALATION_SPRAY | CUTANEOUS | Status: DC | PRN

## 2019-10-29 MED ORDER — ONDANSETRON HCL 4 MG/2ML IJ SOLN: 4.0000 mg | INTRAMUSCULAR | Status: DC | PRN

## 2019-10-29 MED ORDER — ACETAMINOPHEN 325 MG PO TABS: 650.0000 mg | ORAL_TABLET | ORAL | Status: DC | PRN

## 2019-10-29 MED ORDER — LACTATED RINGERS IV SOLN: 500.0000 mL | INTRAVENOUS | Status: DC | PRN

## 2019-10-29 MED ORDER — ONDANSETRON HCL 4 MG/2ML IJ SOLN: 4.0000 mg | Freq: Four times a day (QID) | INTRAMUSCULAR | Status: DC | PRN

## 2019-10-29 MED ORDER — PRENATAL MULTIVITAMIN CH
1.0000 | ORAL_TABLET | Freq: Every day | ORAL | Status: DC
  Administered 2019-10-29 – 2019-10-31 (×3): 1 via ORAL
  Filled 2019-10-29 (×3): qty 1

## 2019-10-29 MED ORDER — COCONUT OIL OIL: 1.0000 "application " | TOPICAL_OIL | Status: DC | PRN

## 2019-10-29 MED ORDER — OXYCODONE-ACETAMINOPHEN 5-325 MG PO TABS: 1.0000 | ORAL_TABLET | ORAL | Status: DC | PRN

## 2019-10-29 MED ORDER — IBUPROFEN 600 MG PO TABS
600.0000 mg | ORAL_TABLET | Freq: Four times a day (QID) | ORAL | Status: DC
  Administered 2019-10-29 – 2019-10-31 (×10): 600 mg via ORAL
  Filled 2019-10-29 (×9): qty 1

## 2019-10-29 MED ORDER — WITCH HAZEL-GLYCERIN EX PADS: 1.0000 "application " | MEDICATED_PAD | CUTANEOUS | Status: DC | PRN

## 2019-10-29 MED ORDER — TETANUS-DIPHTH-ACELL PERTUSSIS 5-2.5-18.5 LF-MCG/0.5 IM SUSP: 0.5000 mL | Freq: Once | INTRAMUSCULAR | Status: DC

## 2019-10-29 MED ORDER — SENNOSIDES-DOCUSATE SODIUM 8.6-50 MG PO TABS
2.0000 | ORAL_TABLET | ORAL | Status: DC
  Administered 2019-10-29 – 2019-10-30 (×2): 2 via ORAL
  Filled 2019-10-29 (×2): qty 2

## 2019-10-29 MED ORDER — SIMETHICONE 80 MG PO CHEW: 80.0000 mg | CHEWABLE_TABLET | ORAL | Status: DC | PRN

## 2019-10-29 MED ORDER — LIDOCAINE HCL (PF) 1 % IJ SOLN: 30.0000 mL | INTRAMUSCULAR | Status: DC | PRN

## 2019-10-29 MED ORDER — OXYTOCIN BOLUS FROM INFUSION
333.0000 mL | Freq: Once | INTRAVENOUS | Status: AC
  Administered 2019-10-29: 333 mL via INTRAVENOUS

## 2019-10-29 MED ORDER — OXYCODONE-ACETAMINOPHEN 5-325 MG PO TABS: 2.0000 | ORAL_TABLET | ORAL | Status: DC | PRN

## 2019-10-29 MED ORDER — ONDANSETRON HCL 4 MG PO TABS: 4.0000 mg | ORAL_TABLET | ORAL | Status: DC | PRN

## 2019-10-29 MED ORDER — SOD CITRATE-CITRIC ACID 500-334 MG/5ML PO SOLN: 30.0000 mL | ORAL | Status: DC | PRN

## 2019-10-29 MED ORDER — ACETAMINOPHEN 325 MG PO TABS
650.0000 mg | ORAL_TABLET | ORAL | Status: DC | PRN
  Administered 2019-10-29: 650 mg via ORAL
  Filled 2019-10-29: qty 2

## 2019-10-29 MED ORDER — ZOLPIDEM TARTRATE 5 MG PO TABS: 5.0000 mg | ORAL_TABLET | Freq: Every evening | ORAL | Status: DC | PRN

## 2019-10-29 MED ORDER — OXYTOCIN-SODIUM CHLORIDE 30-0.9 UT/500ML-% IV SOLN
2.5000 [IU]/h | INTRAVENOUS | Status: DC
  Filled 2019-10-29: qty 500

## 2019-10-29 MED ORDER — LACTATED RINGERS IV SOLN: INTRAVENOUS | Status: DC

## 2019-10-29 MED ORDER — DIPHENHYDRAMINE HCL 25 MG PO CAPS: 25.0000 mg | ORAL_CAPSULE | Freq: Four times a day (QID) | ORAL | Status: DC | PRN

## 2019-10-29 NOTE — Discharge Summary (Addendum)
Postpartum Discharge Summary  Date of Service updated 10/31/19     Patient Name: Sheila Torres DOB: November 28, 1985 MRN: [id number]  Date of admission: 10/29/2019 Delivery date:10/29/2019  Delivering provider: Fatima Blank A  Date of discharge: 10/31/2019  Admitting diagnosis: Normal labor [O80, Z37.9] Intrauterine pregnancy: [redacted]w[redacted]d    Secondary diagnosis:  Active Problems:   NSVD (normal spontaneous vaginal delivery)  Additional problems: pre-e on admit (BPs, P:C 0.61)   Discharge diagnosis: Term Pregnancy Delivered                                              Post partum procedures:none Augmentation: AROM Complications: None  Hospital course: Onset of Labor With Vaginal Delivery      34y.o. yo G3P2002 at 423w0das admitted in Active Labor on 10/29/2019. Patient had an uncomplicated labor course as follows:  Membrane Rupture Time/Date: 1:46 AM ,10/29/2019   Delivery Method:Vaginal, Spontaneous  Episiotomy: None  Lacerations:  None  Patient had an uncomplicated postpartum course.  She is ambulating, tolerating a regular diet, passing flatus, and urinating well. Patient is discharged home in stable condition on 10/31/19.  Newborn Data: Birth date:10/29/2019  Birth time:2:07 AM  Gender:Female  Living status:Living  Apgars:8 ,9  Weight:3150 g   Magnesium Sulfate received: No BMZ received: No Rhophylac:No MMR:No T-DaP:Given prenatally Flu: No Transfusion:No  Physical exam  Vitals:   10/30/19 0500 10/30/19 1402 10/30/19 2153 10/31/19 0437  BP: 101/65 101/63 101/69 (!) 105/55  Pulse: 89 87 79 81  Resp: 19 18 16 18   Temp: 98.2 F (36.8 C) 98 F (36.7 C) 98.4 F (36.9 C) 98.2 F (36.8 C)  TempSrc: Oral Oral Oral Oral  SpO2: 99% 100%  99%  Weight:      Height:       General: alert, cooperative and no distress Lochia: appropriate Uterine Fundus: firm Incision: N/A DVT Evaluation: No evidence of DVT seen on physical exam. Negative Homan's sign. No cords or calf  tenderness. No significant calf/ankle edema. Labs: Lab Results  Component Value Date   WBC 9.7 10/29/2019   HGB 11.0 (L) 10/29/2019   HCT 36.6 10/29/2019   MCV 89.7 10/29/2019   PLT 313 10/29/2019   CMP Latest Ref Rng & Units 10/29/2019  Glucose 70 - 99 mg/dL 83  BUN 6 - 20 mg/dL 10  Creatinine 0.44 - 1.00 mg/dL 0.53  Sodium 135 - 145 mmol/L 134(L)  Potassium 3.5 - 5.1 mmol/L 3.8  Chloride 98 - 111 mmol/L 102  CO2 22 - 32 mmol/L 21(L)  Calcium 8.9 - 10.3 mg/dL 9.1  Total Protein 6.5 - 8.1 g/dL 7.1  Total Bilirubin 0.3 - 1.2 mg/dL 1.7(H)  Alkaline Phos 38 - 126 U/L 181(H)  AST 15 - 41 U/L 28  ALT 0 - 44 U/L 18   Edinburgh Score: Edinburgh Postnatal Depression Scale Screening Tool 10/30/2019  I have been able to laugh and see the funny side of things. 0  I have looked forward with enjoyment to things. 0  I have blamed myself unnecessarily when things went wrong. 1  I have been anxious or worried for no good reason. 2  I have felt scared or panicky for no good reason. 0  Things have been getting on top of me. 1  I have been so unhappy that I have had difficulty sleeping. 0  I have felt sad or miserable. 0  I have been so unhappy that I have been crying. 0  The thought of harming myself has occurred to me. 0  Edinburgh Postnatal Depression Scale Total 4     After visit meds:  Allergies as of 10/31/2019   No Known Allergies     Medication List    STOP taking these medications   acetaminophen 325 MG tablet Commonly known as: TYLENOL   IRON PO   oxyCODONE-acetaminophen 5-325 MG tablet Commonly known as: PERCOCET/ROXICET   prenatal multivitamin Tabs tablet     TAKE these medications   CitraNatal Rx 27-1 MG Tabs Take 1 tablet by mouth daily.   ibuprofen 600 MG tablet Commonly known as: ADVIL Take 1 tablet (600 mg total) by mouth every 6 (six) hours as needed for mild pain, moderate pain or cramping. What changed:   when to take this  reasons to take this         Discharge home in stable condition Infant Feeding: Breast Infant Disposition:home with mother Discharge instruction: per After Visit Summary and Postpartum booklet. Activity: Advance as tolerated. Pelvic rest for 6 weeks.  Diet: routine diet Future Appointments: Future Appointments  Date Time Provider [place]  11/06/2019  9:20 AM [facility] NURSE [facility]   Follow up Visit:  Follow-up Information    Department, [facility]. Schedule an appointment as soon as possible for a visit.   Why: in 4-6 weeks for your postpartum visit.  You have a blood pressure check on 9/14 @ 9:20am at [facility] Contact information: [address] [place] [phone]               Pt instructed to call [facility] to schedule PP visit in 4-6 weeks.     Additional Postpartum F/U:BP check 1 week at [place] risk pregnancy complicated by: HTN Delivery mode:  Vaginal, Spontaneous  Anticipated Birth Control:  abstinence until IUD   10/31/2019 Roma Schanz, CNM

## 2019-10-29 NOTE — MAU Note (Signed)
Pt here with reports of contractions that started around 11pm. Denies LOF or vaginal bleeding. Good fetal movement. Cervix closed on last exam.

## 2019-10-29 NOTE — H&P (Signed)
Sheila Torres is a 34 y.o. female G3P2002 at [redacted]w[redacted]d pt of [facility] presenting for labor evaluation.  She has elevated BP on admission for the first time 10/29/19.  Pregnancy complicated by small VSD on fetal echo.    GBS is negative Anatomy US at 21 weeks was wnl  OB History    Gravida  3   Para  2   Term  2   Preterm  0   AB  0   Living  2     SAB  0   TAB  0   Ectopic  0   Multiple  0   Live Births  2          Past Medical History:  Diagnosis Date   Anemia    Anemia    Atrial septal defect    had repair years ago   Atrial septal defect    Sickle cell anemia (HCC)    a+ variant   Thalassemia    Past Surgical History:  Procedure Laterality Date   ASD REPAIR  2009   atrail septal defect repair     WISDOM TOOTH EXTRACTION  2011   Family History: family history includes Hypertension in her mother. Social History:  reports that she has never smoked. She has never used smokeless tobacco. She reports that she does not drink alcohol and does not use drugs.     Maternal Diabetes: No Genetic Screening: Normal Maternal Ultrasounds/Referrals: Normal Fetal Ultrasounds or other Referrals:  Fetal echo Maternal Substance Abuse:  No Significant Maternal Medications:  None Significant Maternal Lab Results:  Group B Strep negative Other Comments:  None  Review of Systems  Constitutional: Negative for chills, fatigue and fever.  Eyes: Negative for visual disturbance.  Respiratory: Negative for shortness of breath.   Cardiovascular: Negative for chest pain.  Gastrointestinal: Positive for abdominal pain. Negative for vomiting.  Genitourinary: Negative for difficulty urinating, dysuria, flank pain, pelvic pain, vaginal bleeding, vaginal discharge and vaginal pain.  Musculoskeletal: Positive for back pain.  Neurological: Negative for dizziness and headaches.  Psychiatric/Behavioral: Negative.    Maternal Medical History:  Reason for admission: Contractions.    Contractions: Onset was 6-12 hours ago.   Perceived severity is strong.    Fetal activity: Perceived fetal activity is normal.   Last perceived fetal movement was within the past hour.    Prenatal complications: no prenatal complications Prenatal Complications - Diabetes: none.    Dilation: 4.5 Effacement (%): 90 Exam by:: Holly Flippin, RN Blood pressure (!) 148/88, pulse 92, resp. rate 18, last menstrual period 02/16/2019, SpO2 97 %, unknown if currently breastfeeding. Maternal Exam:  Uterine Assessment: Contraction strength is moderate.  Contraction frequency is regular.   Abdomen: Fetal presentation: vertex  Cervix: Cervix evaluated by digital exam.     Fetal Exam Fetal Monitor Review: Mode: ultrasound.   Baseline rate: 135.  Variability: moderate (6-25 bpm).   Pattern: accelerations present and no decelerations.    Fetal State Assessment: Category I - tracings are normal.     Physical Exam Vitals and nursing note reviewed.  Constitutional:      Appearance: She is well-developed.  Cardiovascular:     Rate and Rhythm: Normal rate and regular rhythm.     Heart sounds: Normal heart sounds.  Pulmonary:     Effort: Pulmonary effort is normal.  Abdominal:     Palpations: Abdomen is soft.  Musculoskeletal:        General: Normal range of motion.  Cervical back: Normal range of motion.  Skin:    General: Skin is warm and dry.  Neurological:     Mental Status: She is alert and oriented to person, place, and time.  Psychiatric:        Behavior: Behavior normal.        Thought Content: Thought content normal.        Judgment: Judgment normal.     Prenatal labs: ABO, Rh:   O positive Antibody:  negative Rubella:  Immune RPR:   nonreactive HBsAg:   negative HIV:   Nonreactive GBS:   Negative  Assessment/Plan: 34 y.o. [id number] at [redacted]w[redacted]d Active labor at term GBS negative HTN without diagnosis  Admit to L&D PEC labs pending May have  epidural Anticipate NSVD  Sharen Counter 10/29/2019, 1:04 AM

## 2019-10-30 DIAGNOSIS — Z3A40 40 weeks gestation of pregnancy: Secondary | ICD-10-CM

## 2019-10-30 NOTE — Progress Notes (Addendum)
POSTPARTUM PROGRESS NOTE  Post Partum Day 1  Subjective:  Sheila Torres is a 34 y.o. [id number] s/p SVD at [redacted]w[redacted]d.  She reports she is doing well. No acute events overnight. She denies any problems with ambulating, voiding or po intake. Denies nausea or vomiting.  Pain is well controlled.  Lochia is mild.  Objective: Blood pressure 101/65, pulse 89, temperature 98.2 F (36.8 C), temperature source Oral, resp. rate 19, height 5' (1.524 m), weight 56.7 kg, last menstrual period 02/16/2019, SpO2 99 %, unknown if currently breastfeeding.  Physical Exam:  General: alert, cooperative and no distress Chest: no respiratory distress Heart:regular rate, distal pulses intact Abdomen: soft  Uterine Fundus: firm, appropriately tender DVT Evaluation: No calf swelling or tenderness Extremities: no edema Skin: warm, dry  Recent Labs    10/29/19 0104  HGB 11.0*  HCT 36.6    Assessment/Plan: Sheila Torres is a 34 y.o. [id number] s/p SVD at [redacted]w[redacted]d   PPD#1 - Doing well  Routine postpartum care  Contraception: Considering IUD at pp visit  Feeding: Breast and bottle feeding, doing well   Dispo: Plan for discharge tomorrow.   LOS: 1 day   Leticia Penna, DO  Family Medicine PGY-3  10/30/2019, 9:11 AM  Attestation of Supervision of Student:  I confirm that I have verified the information documented in the  resident  student's note and that I have also personally  reperformed  the history, physical exam and all medical decision making activities.  I have verified that all services and findings are accurately documented in this student's note; and I agree with management and plan as outlined in the documentation. I have also made any necessary editorial changes.   Marylene Land, CNM [facility] [employer] 10/30/2019 11:12 AM

## 2019-10-31 DIAGNOSIS — O1490 Unspecified pre-eclampsia, unspecified trimester: Secondary | ICD-10-CM

## 2019-10-31 MED ORDER — IBUPROFEN 600 MG PO TABS: 600.0000 mg | ORAL_TABLET | Freq: Four times a day (QID) | ORAL | 0 refills | Status: AC | PRN

## 2019-10-31 NOTE — Discharge Instructions (Signed)
NO SEX UNTIL AFTER YOU GET YOUR BIRTH CONTROL   Go to [facility] hospital for these signs of pre-eclampsia:  Severe headache that does not go away with Tylenol  Visual changes- seeing spots, double, blurred vision  Pain under your right breast or upper abdomen that does not go away with Tums or heartburn medicine  Nausea and/or vomiting  Severe swelling in your hands, feet, and face     Postpartum Care After Vaginal Delivery This sheet gives you information about how to care for yourself from the time you deliver your baby to up to 6-12 weeks after delivery (postpartum period). Your health care provider may also give you more specific instructions. If you have problems or questions, contact your health care provider. Follow these instructions at home: Vaginal bleeding  It is normal to have vaginal bleeding (lochia) after delivery. Wear a sanitary pad for vaginal bleeding and discharge. ? During the first week after delivery, the amount and appearance of lochia is often similar to a menstrual period. ? Over the next few weeks, it will gradually decrease to a dry, yellow-brown discharge. ? For most women, lochia stops completely by 4-6 weeks after delivery. Vaginal bleeding can vary from woman to woman.  Change your sanitary pads frequently. Watch for any changes in your flow, such as: ? A sudden increase in volume. ? A change in color. ? Large blood clots.  If you pass a blood clot from your vagina, save it and call your health care provider to discuss. Do not flush blood clots down the toilet before talking with your health care provider.  Do not use tampons or douches until your health care provider says this is safe.  If you are not breastfeeding, your period should return 6-8 weeks after delivery. If you are feeding your child breast milk only (exclusive breastfeeding), your period may not return until you stop breastfeeding. Perineal care  Keep the area between the vagina and  the anus (perineum) clean and dry as told by your health care provider. Use medicated pads and pain-relieving sprays and creams as directed.  If you had a cut in the perineum (episiotomy) or a tear in the vagina, check the area for signs of infection until you are healed. Check for: ? More redness, swelling, or pain. ? Fluid or blood coming from the cut or tear. ? Warmth. ? Pus or a bad smell.  You may be given a squirt bottle to use instead of wiping to clean the perineum area after you go to the bathroom. As you start healing, you may use the squirt bottle before wiping yourself. Make sure to wipe gently.  To relieve pain caused by an episiotomy, a tear in the vagina, or swollen veins in the anus (hemorrhoids), try taking a warm sitz bath 2-3 times a day. A sitz bath is a warm water bath that is taken while you are sitting down. The water should only come up to your hips and should cover your buttocks. Breast care  Within the first few days after delivery, your breasts may feel heavy, full, and uncomfortable (breast engorgement). Milk may also leak from your breasts. Your health care provider can suggest ways to help relieve the discomfort. Breast engorgement should go away within a few days.  If you are breastfeeding: ? Wear a bra that supports your breasts and fits you well. ? Keep your nipples clean and dry. Apply creams and ointments as told by your health care provider. ? You may  need to use breast pads to absorb milk that leaks from your breasts. ? You may have uterine contractions every time you breastfeed for up to several weeks after delivery. Uterine contractions help your uterus return to its normal size. ? If you have any problems with breastfeeding, work with your health care provider or Advertising copywriter.  If you are not breastfeeding: ? Avoid touching your breasts a lot. Doing this can make your breasts produce more milk. ? Wear a good-fitting bra and use cold packs to  help with swelling. ? Do not squeeze out (express) milk. This causes you to make more milk. Intimacy and sexuality  Ask your health care provider when you can engage in sexual activity. This may depend on: ? Your risk of infection. ? How fast you are healing. ? Your comfort and desire to engage in sexual activity.  You are able to get pregnant after delivery, even if you have not had your period. If desired, talk with your health care provider about methods of birth control (contraception). Medicines  Take over-the-counter and prescription medicines only as told by your health care provider.  If you were prescribed an antibiotic medicine, take it as told by your health care provider. Do not stop taking the antibiotic even if you start to feel better. Activity  Gradually return to your normal activities as told by your health care provider. Ask your health care provider what activities are safe for you.  Rest as much as possible. Try to rest or take a nap while your baby is sleeping. Eating and drinking   Drink enough fluid to keep your urine pale yellow.  Eat high-fiber foods every day. These may help prevent or relieve constipation. High-fiber foods include: ? Whole grain cereals and breads. ? Brown rice. ? Beans. ? Fresh fruits and vegetables.  Do not try to lose weight quickly by cutting back on calories.  Take your prenatal vitamins until your postpartum checkup or until your health care provider tells you it is okay to stop. Lifestyle  Do not use any products that contain nicotine or tobacco, such as cigarettes and e-cigarettes. If you need help quitting, ask your health care provider.  Do not drink alcohol, especially if you are breastfeeding. General instructions  Keep all follow-up visits for you and your baby as told by your health care provider. Most women visit their health care provider for a postpartum checkup within the first 3-6 weeks after delivery. Contact a  health care provider if:  You feel unable to cope with the changes that your child brings to your life, and these feelings do not go away.  You feel unusually sad or worried.  Your breasts become red, painful, or hard.  You have a fever.  You have trouble holding urine or keeping urine from leaking.  You have little or no interest in activities you used to enjoy.  You have not breastfed at all and you have not had a menstrual period for 12 weeks after delivery.  You have stopped breastfeeding and you have not had a menstrual period for 12 weeks after you stopped breastfeeding.  You have questions about caring for yourself or your baby.  You pass a blood clot from your vagina. Get help right away if:  You have chest pain.  You have difficulty breathing.  You have sudden, severe leg pain.  You have severe pain or cramping in your lower abdomen.  You bleed from your vagina so much that you  fill more than one sanitary pad in one hour. Bleeding should not be heavier than your heaviest period.  You develop a severe headache.  You faint.  You have blurred vision or spots in your vision.  You have bad-smelling vaginal discharge.  You have thoughts about hurting yourself or your baby. If you ever feel like you may hurt yourself or others, or have thoughts about taking your own life, get help right away. You can go to the nearest emergency department or call:  Your local emergency services (911 in the [place]  A suicide crisis helpline, such as the National Suicide Prevention Lifeline at (256) 320-7600. This is open 24 hours a day. Summary  The period of time right after you deliver your newborn up to 6-12 weeks after delivery is called the postpartum period.  Gradually return to your normal activities as told by your health care provider.  Keep all follow-up visits for you and your baby as told by your health care provider. This information is not intended to replace  advice given to you by your health care provider. Make sure you discuss any questions you have with your health care provider. Document Revised: 02/11/2017 Document Reviewed: 11/22/2016 Elsevier Patient Education  2020 [employer].

## 2019-11-06 ENCOUNTER — Ambulatory Visit: Payer: Medicaid Other

## 2021-10-31 IMAGING — US US MFM OB DETAIL+14 WK
1 series · 13 of 28 positions shown · non-contrast
Comparison: none

[Series 1: us mfm ob detail+14 wk · 84 acquisitions, 13 frames shown]
[im 4/84]
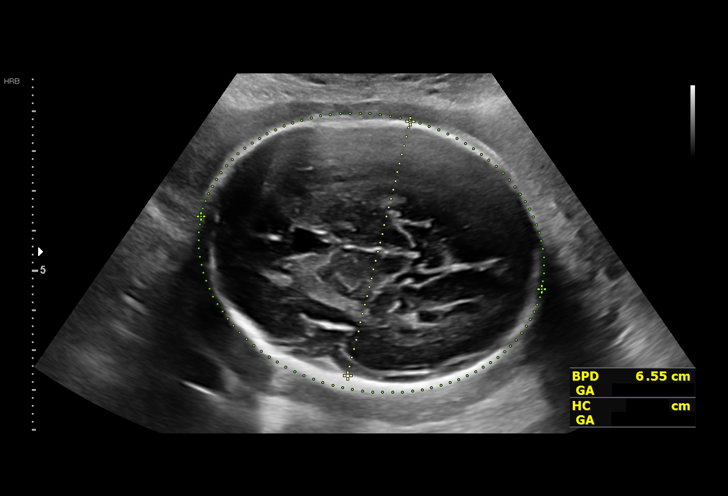
[im 10/84]
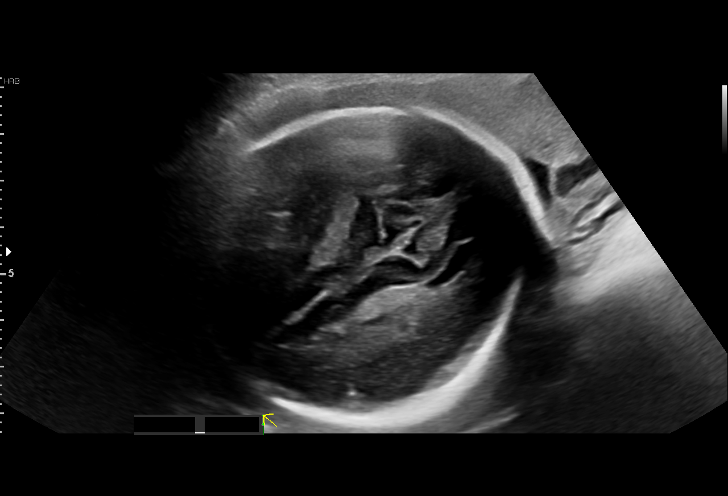
[im 16/84]
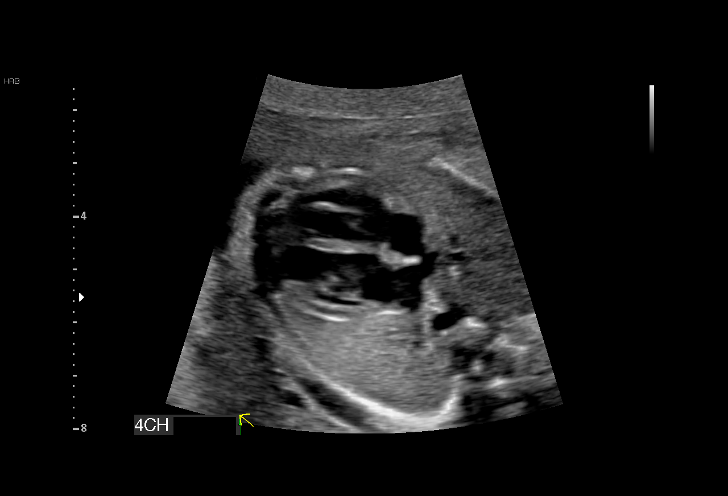
[im 22/84]
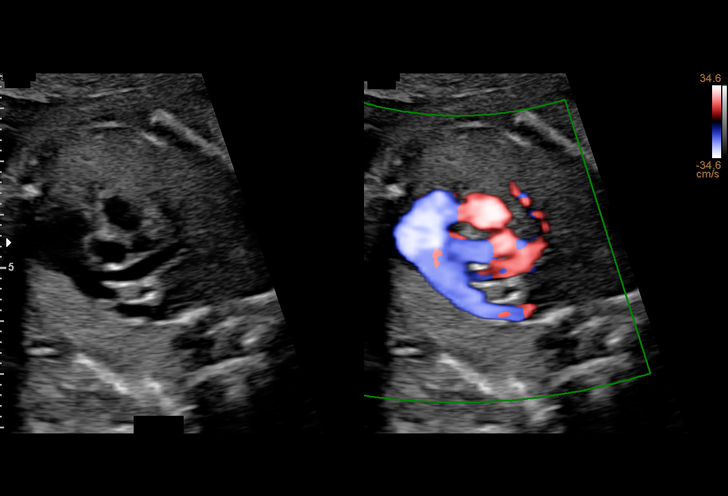
[im 28/84]
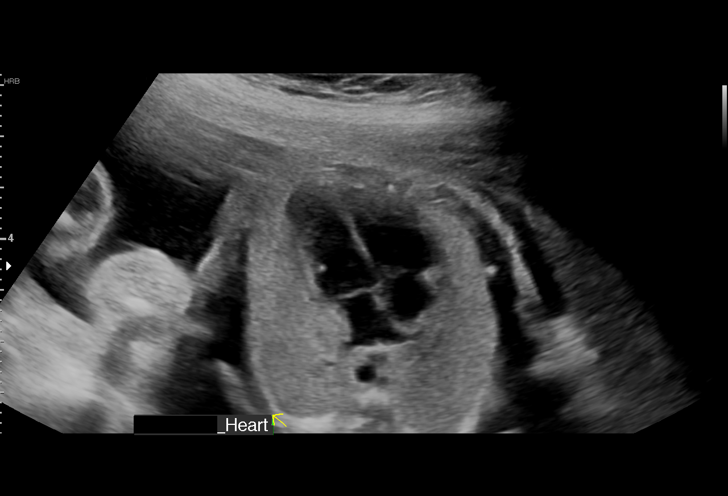
[im 34/84]
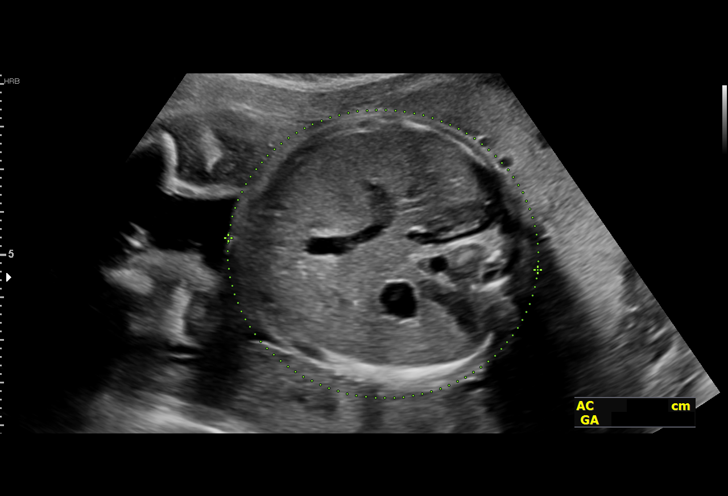
[im 44/84]
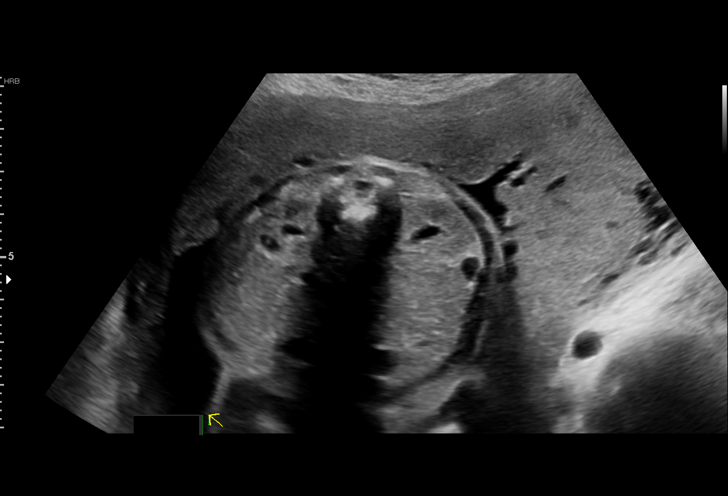
[im 50/84]
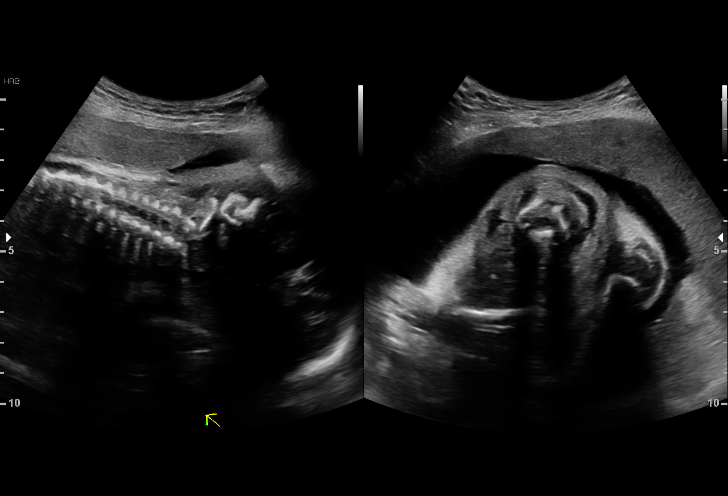
[im 56/84]
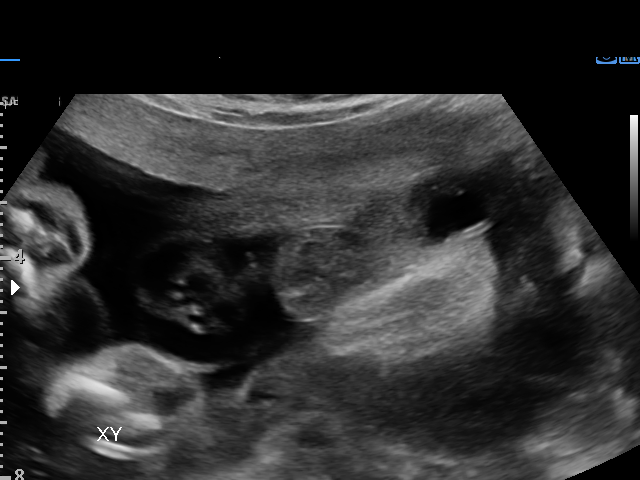
[im 62/84]
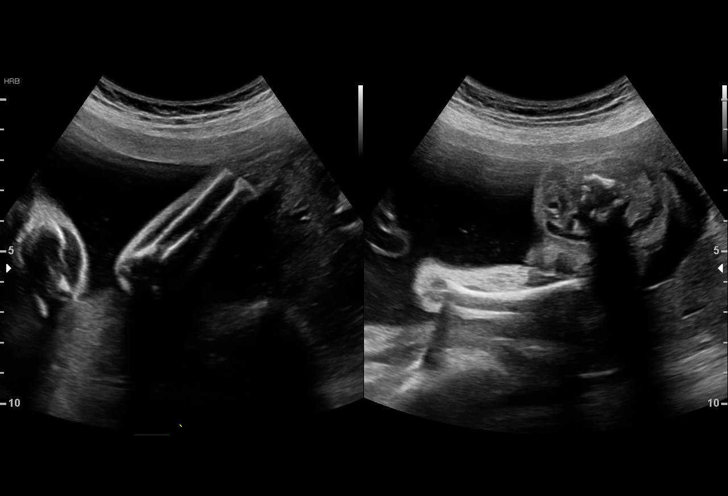
[im 68/84]
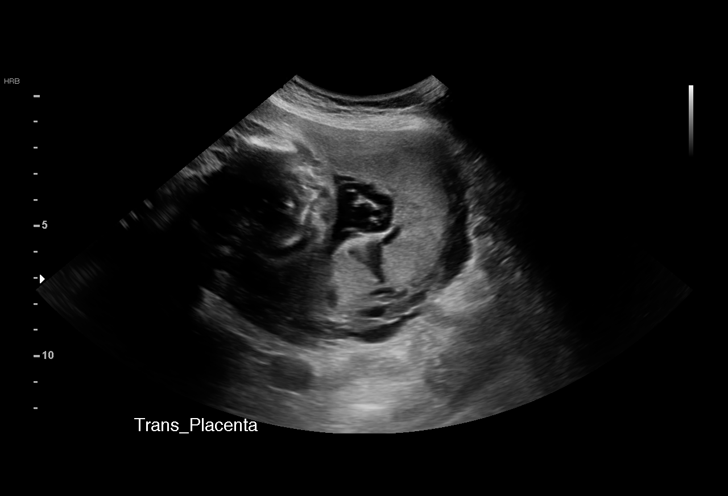
[im 74/84]
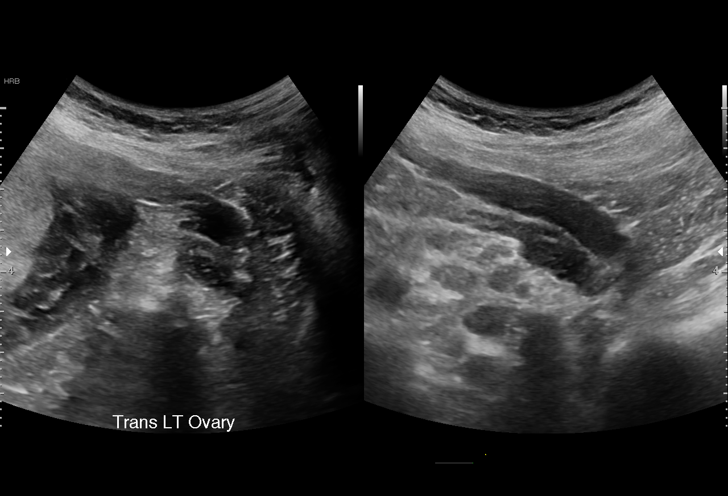
[im 80/84]
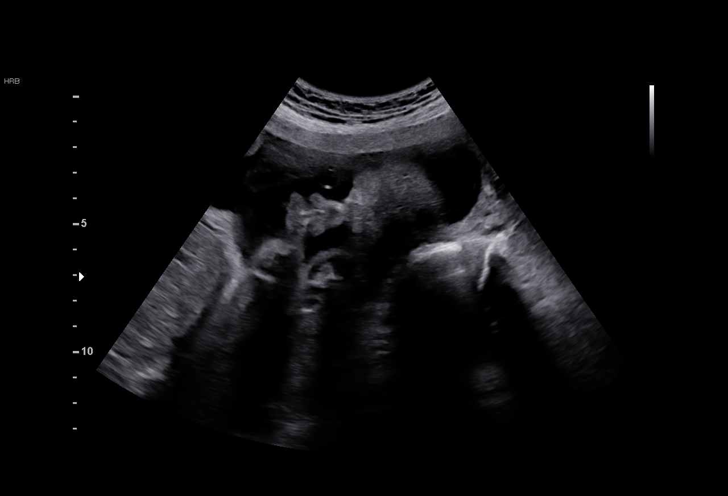

[13 of 28 positions shown; findings below may reference images not displayed]

[REDACTED]-
                   Faculty Physician

Indications

 Medical complication of pregnancy (Maternal
 ASD - repaired)
 History of sickle cell trait
 Encounter for uncertain dates
 26 weeks gestation of pregnancy
 Encounter for antenatal screening for
 malformations (declined NIPS)
Fetal Evaluation

 Num Of Fetuses:         1
 Fetal Heart Rate(bpm):  171
 Cardiac Activity:       Observed
 Presentation:           Cephalic
 Placenta:               Left lateral
 P. Cord Insertion:      Visualized, central

 Amniotic Fluid
 AFI FV:      Within normal limits

                             Largest Pocket(cm)

Biometry

 BPD:      65.2  mm     G. Age:  26w 2d         36  %    CI:        70.97   %    70 - 86
                                                         FL/HC:      19.5   %    18.6 -
 HC:      246.6  mm     G. Age:  26w 5d         35  %    HC/AC:      1.12        1.04 -
 AC:      220.2  mm     G. Age:  26w 3d         42  %    FL/BPD:     73.8   %    71 - 87
 FL:       48.1  mm     G. Age:  26w 1d         26  %    FL/AC:      21.8   %    20 - 24
 LV:        2.3  mm
 CM:        5.5  mm

 Est. FW:     926  gm      2 lb 1 oz     35  %
OB History

 Gravidity:    3         Term:   2
 Living:       2
Gestational Age

 U/S Today:     26w 3d                                        EDD:   10/29/19
 Best:          26w 3d     Det. By:  U/S (07/26/19)           EDD:   10/29/19
Anatomy

 Cranium:               Appears normal         Aortic Arch:            Appears normal
 Cavum:                 Appears normal         Ductal Arch:            Not well visualized
 Ventricles:            Appears normal         Diaphragm:              Appears normal
 Choroid Plexus:        Appears normal         Stomach:                Appears normal, left
                                                                       sided
 Cerebellum:            Appears normal         Abdomen:                Appears normal
 Posterior Fossa:       Appears normal         Abdominal Wall:         Not well visualized
 Nuchal Fold:           Not applicable (>20    Cord Vessels:           Appears normal (3
                        wks GA)                                        vessel cord)
 Face:                  Appears normal         Kidneys:                Appear normal
                        (orbits and profile)
 Lips:                  Appears normal         Bladder:                Appears normal
 Thoracic:              Appears normal         Spine:                  Appears normal
 Heart:                 Appears normal         Upper Extremities:      Appears normal
                        (4CH, axis, and
                        situs)
 RVOT:                  Appears normal         Lower Extremities:      Appears normal
 LVOT:                  Appears normal

 Other:  Male gender. Heels visualized. Nasal bone visualized. Technically
         difficult due to advanced gestational age.
Cervix Uterus Adnexa

 Cervix
 Not visualized (advanced GA >57wks)

 Uterus
 No abnormality visualized.

 Right Ovary
 Within normal limits.

 Left Ovary
 Within normal limits.

 Cul De Sac
 No free fluid seen.

 Adnexa
 No abnormality visualized.
Comments

 This patient was seen for a detailed fetal anatomy scan as
 she has a history of an ASD that required surgical repair as a
 child.  She reports that she already had a normal fetal
 echocardiogram performed at [facility].  The
 patient is also a carrier of the sickle cell trait.
 She denies any other significant past medical history and
 denies any problems in her current pregnancy.
 She already had a consultation with our genetic counselor
 and has declined all screening tests for fetal aneuploidy in
 her current pregnancy.
 Based on the fetal biometry measurements obtained today,
 her EDC is October 29, 2019.
 There were no obvious fetal anomalies noted on today's
 ultrasound exam.  However, today's exam was limited due to
 the fetal position.
 The patient was informed that anomalies may be missed due
 to technical limitations. If the fetus is in a suboptimal position
 or maternal habitus is increased, visualization of the fetus in
 the maternal uterus may be impaired.
 A follow-up exam was scheduled in 4 weeks to confirm her
 dates.

## 2022-10-27 ENCOUNTER — Encounter: Payer: Self-pay | Admitting: Student

## 2023-11-02 ENCOUNTER — Inpatient Hospital Stay: Admitting: Oncology

## 2023-11-02 ENCOUNTER — Inpatient Hospital Stay
